# Patient Record
Sex: Female | Born: 1965 | Race: Black or African American | Hispanic: No | Marital: Single | Smoking: Current every day smoker
Health system: Southern US, Community
[De-identification: ages and names within clinical notes are randomized; demographics above are authoritative.]

## PROBLEM LIST (undated history)

## (undated) ENCOUNTER — Emergency Department: Admission: EM | Payer: Self-pay

## (undated) DIAGNOSIS — I10 Essential (primary) hypertension: Secondary | ICD-10-CM

## (undated) DIAGNOSIS — E7800 Pure hypercholesterolemia, unspecified: Secondary | ICD-10-CM

## (undated) DIAGNOSIS — A599 Trichomoniasis, unspecified: Secondary | ICD-10-CM

## (undated) HISTORY — DX: Trichomoniasis, unspecified: A59.9

---

## 2019-12-28 ENCOUNTER — Inpatient Hospital Stay: Admit: 2019-12-28 | Discharge: 2019-12-28 | Disposition: A | Discharge status: Home / Self Care | Payer: Self-pay
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
## 2020-02-04 ENCOUNTER — Ambulatory Visit: Payer: Self-pay | Admitting: Physician Assistant

## 2020-02-04 ENCOUNTER — Other Ambulatory Visit: Payer: Self-pay

## 2020-02-04 ENCOUNTER — Encounter: Payer: Self-pay | Admitting: Physician Assistant

## 2020-02-04 DIAGNOSIS — Z7189 Other specified counseling: Secondary | ICD-10-CM

## 2020-02-04 DIAGNOSIS — Z113 Encounter for screening for infections with a predominantly sexual mode of transmission: Secondary | ICD-10-CM

## 2020-02-04 DIAGNOSIS — I10 Essential (primary) hypertension: Secondary | ICD-10-CM | POA: Insufficient documentation

## 2020-02-04 DIAGNOSIS — B9689 Other specified bacterial agents as the cause of diseases classified elsewhere: Secondary | ICD-10-CM

## 2020-02-04 DIAGNOSIS — E7800 Pure hypercholesterolemia, unspecified: Secondary | ICD-10-CM | POA: Insufficient documentation

## 2020-02-04 DIAGNOSIS — Z8619 Personal history of other infectious and parasitic diseases: Secondary | ICD-10-CM | POA: Insufficient documentation

## 2020-02-04 DIAGNOSIS — N760 Acute vaginitis: Secondary | ICD-10-CM

## 2020-02-04 LAB — WET PREP FOR TRICH, YEAST, CLUE
Trichomonas Exam: NEGATIVE
Yeast Exam: NEGATIVE

## 2020-02-04 MED ORDER — METRONIDAZOLE 500 MG PO TABS: 500.0000 mg | ORAL_TABLET | Freq: Two times a day (BID) | ORAL | 0 refills | Status: AC

## 2020-02-04 MED ORDER — METRONIDAZOLE 500 MG PO TABS: 2000.0000 mg | ORAL_TABLET | Freq: Once | ORAL | 0 refills | Status: DC

## 2020-02-04 NOTE — Progress Notes (Signed)
Wet mount reviewed by provider; pt treated for BV per provider orders. Provider orders completed. 

## 2020-02-04 NOTE — Progress Notes (Signed)
[facility] Department STI clinic/screening visit  Subjective:  Gloria Perkins is a 54 y.o. female being seen today for an STI screening visit. The patient reports they do have symptoms.  Patient reports that they do not desire a pregnancy in the next year.   They reported they are not interested in discussing contraception today.  No LMP recorded (approximate).   Patient has the following medical conditions:  There are no problems to display for this patient.   Chief Complaint  Patient presents with  . SEXUALLY TRANSMITTED DISEASE    screening    HPI  Patient reports that she has had brownish discharge with slight itching for about 1 week.  Also states that she has been having some cramping on the right side of her abdomen for about that long as well.  Reports h/o HTN and high cholesterol but she is not currently taking any medicines for these conditions.  Last HIV test was about 1 year ago, is not sure when last pap was and has not had a period for 4 years or so/postmenopausal.  States that she was treated for Syphillis in [place] about 25-30 years ago.    See flowsheet for further details and programmatic requirements.    The following portions of the patient's history were reviewed and updated as appropriate: allergies, current medications, past medical history, past social history, past surgical history and problem list.  Objective:  There were no vitals filed for this visit.  Physical Exam Constitutional:      General: She is not in acute distress.    Appearance: Normal appearance.  HENT:     Head: Normocephalic and atraumatic.     Comments: No nits, lice or hair loss. No cervical, supraclavicular or axillary adenopathy.    Mouth/Throat:     Mouth: Mucous membranes are moist.     Pharynx: Oropharynx is clear. No oropharyngeal exudate or posterior oropharyngeal erythema.  Eyes:     Conjunctiva/sclera: Conjunctivae normal.  Pulmonary:     Effort: Pulmonary effort  is normal.  Abdominal:     Palpations: Abdomen is soft. There is no mass.     Tenderness: There is no abdominal tenderness. There is no guarding or rebound.  Genitourinary:    General: Normal vulva.     Rectum: Normal.     Comments: External genitalia/pubic area without nits, lice, edema, erythema, lesions and inguinal adenopathy. Vagina with normal mucosa, small amount of thin, white, discharge, pH= .4.5. Cervix without visible lesions. Uterus firm, mobile, nt, no masses, no CMT, no adnexal tenderness or fullness. Musculoskeletal:     Cervical back: Neck supple. No tenderness.  Skin:    General: Skin is warm and dry.     Findings: No bruising, erythema, lesion or rash.  Neurological:     Mental Status: She is alert and oriented to person, place, and time.  Psychiatric:        Mood and Affect: Mood normal.        Behavior: Behavior normal.        Thought Content: Thought content normal.        Judgment: Judgment normal.      Assessment and Plan:  Gloria Perkins is a 54 y.o. female presenting to the [facility] Department for STI screening  1. Screening for STD (sexually transmitted disease) Patient into clinic with symptoms. Rec condoms with all sex. Await test results.  Counseled that RN will call if needs to RTC for treatment once results are  back. - WET PREP FOR TRICH, YEAST, CLUE - Gonococcus culture - Chlamydia/Gonorrhea [place] [facility] - HIV [place] [facility] - Syphilis Serology, [place] [facility]  2. Bacterial vaginosis Treat for BV with Metronidazole 500mg  #14 1 po BID for 7 days with food, no EtOH for 24 hr before and until 72 hr after completing medicine. No sex for 7 days. Enc to use OTC antifungal cream if has itching during or just after treatment with antibiotic. - metroNIDAZOLE (FLAGYL) 500 MG tablet; Take 1 tablet (500 mg total) by mouth 2 (two) times daily for 7 days.  Dispense: 14 tablet; Refill: 0  3. Other specified counseling Enc patient to establish  with PCP for follow up on HTN and high cholesterol. PCP list given to patient today.     No follow-ups on file.  No future appointments.  , PA

## 2020-02-09 LAB — GONOCOCCUS CULTURE

## 2020-02-27 ENCOUNTER — Telehealth: Payer: Self-pay | Admitting: Family Medicine

## 2020-02-27 NOTE — Telephone Encounter (Signed)
pls call me I need my test results

## 2020-02-27 NOTE — Telephone Encounter (Signed)
Returned call to pt at phone # provided. Pt reports she would like to know her TR's from 02/04/2020. Verified I was speaking with pt with pt's name, DOB, and password. Counseled pt regarding her negative TR's for GC culture, GC, Chlamydia, and HIV. Consulted with Sadie Haber, PA regarding pt's RPR result and per Sadie Haber, PA based on RPR numbers it just shows [place] that pt has a history of RPR but does not need any further treatment at this time and just to continue getting RPR blood work done annually. Counseled pt per Sadie Haber, PA regarding pt's RPR result and pt states understanding. Counseled pt that if she needs copies of her TR's that I could set up a TR appt for pt, but pt declines at this time. Pt with no further questions or concerns at this time.

## 2020-02-27 NOTE — Telephone Encounter (Signed)
Consulted by RN re: RPR results and how to counsel patient.  Reviewed RN note and agree that it reflects discussion and recommendations.

## 2020-07-22 ENCOUNTER — Emergency Department
Admission: EM | Admit: 2020-07-22 | Discharge: 2020-07-23 | Disposition: A | Discharge status: Home / Self Care | Payer: Self-pay | Attending: Emergency Medicine | Admitting: Emergency Medicine

## 2020-07-22 ENCOUNTER — Emergency Department: Payer: Self-pay

## 2020-07-22 ENCOUNTER — Other Ambulatory Visit: Payer: Self-pay

## 2020-07-22 DIAGNOSIS — M2540 Effusion, unspecified joint: Secondary | ICD-10-CM

## 2020-07-22 DIAGNOSIS — I10 Essential (primary) hypertension: Secondary | ICD-10-CM | POA: Insufficient documentation

## 2020-07-22 DIAGNOSIS — M25532 Pain in left wrist: Secondary | ICD-10-CM | POA: Insufficient documentation

## 2020-07-22 DIAGNOSIS — F17210 Nicotine dependence, cigarettes, uncomplicated: Secondary | ICD-10-CM | POA: Insufficient documentation

## 2020-07-22 DIAGNOSIS — R2232 Localized swelling, mass and lump, left upper limb: Secondary | ICD-10-CM | POA: Insufficient documentation

## 2020-07-22 HISTORY — DX: Essential (primary) hypertension: I10

## 2020-07-22 LAB — COMPREHENSIVE METABOLIC PANEL
ALT: 21 U/L (ref 0–44)
AST: 23 U/L (ref 15–41)
Albumin: 4.2 g/dL (ref 3.5–5.0)
Alkaline Phosphatase: 97 U/L (ref 38–126)
Anion gap: 11 (ref 5–15)
BUN: 14 mg/dL (ref 6–20)
CO2: 26 mmol/L (ref 22–32)
Calcium: 9.1 mg/dL (ref 8.9–10.3)
Chloride: 101 mmol/L (ref 98–111)
Creatinine, Ser: 0.66 mg/dL (ref 0.44–1.00)
GFR, Estimated: >60 mL/min (ref >60)
Glucose, Bld: 96 mg/dL (ref 70–99)
Potassium: 4.1 mmol/L (ref 3.5–5.1)
Sodium: 138 mmol/L (ref 135–145)
Total Bilirubin: 0.8 mg/dL (ref 0.3–1.2)
Total Protein: 7.6 g/dL (ref 6.5–8.1)

## 2020-07-22 LAB — CBC WITH DIFFERENTIAL/PLATELET
Abs Immature Granulocytes: 0.01 10*3/uL (ref 0.00–0.07)
Basophils Absolute: 0.1 10*3/uL (ref 0.0–0.1)
Basophils Relative: 1 %
Eosinophils Absolute: 0.1 10*3/uL (ref 0.0–0.5)
Eosinophils Relative: 2 %
HCT: 40.7 % (ref 36.0–46.0)
Hemoglobin: 14.3 g/dL (ref 12.0–15.0)
Immature Granulocytes: 0 %
Lymphocytes Relative: 29 %
Lymphs Abs: 2.3 10*3/uL (ref 0.7–4.0)
MCH: 30 pg (ref 26.0–34.0)
MCHC: 35.1 g/dL (ref 30.0–36.0)
MCV: 85.5 fL (ref 80.0–100.0)
Monocytes Absolute: 0.7 10*3/uL (ref 0.1–1.0)
Monocytes Relative: 9 %
Neutro Abs: 4.6 10*3/uL (ref 1.7–7.7)
Neutrophils Relative %: 59 %
Platelets: 173 10*3/uL (ref 150–400)
RBC: 4.76 MIL/uL (ref 3.87–5.11)
RDW: 13.6 % (ref 11.5–15.5)
WBC: 7.9 10*3/uL (ref 4.0–10.5)
nRBC: 0 % (ref 0.0–0.2)

## 2020-07-22 LAB — URINALYSIS, COMPLETE (UACMP) WITH MICROSCOPIC
Bacteria, UA: NONE SEEN
Bilirubin Urine: NEGATIVE
Glucose, UA: NEGATIVE mg/dL
Hgb urine dipstick: NEGATIVE
Ketones, ur: NEGATIVE mg/dL
Leukocytes,Ua: NEGATIVE
Nitrite: NEGATIVE
Protein, ur: NEGATIVE mg/dL
Specific Gravity, Urine: 1.005 (ref 1.005–1.030)
pH: 7 (ref 5.0–8.0)

## 2020-07-22 LAB — URIC ACID: Uric Acid, Serum: 3.8 mg/dL (ref 2.5–7.1)

## 2020-07-22 MED ORDER — PREDNISONE 20 MG PO TABS: 60.0000 mg | ORAL_TABLET | Freq: Once | ORAL | Status: DC

## 2020-07-22 MED ORDER — CYCLOBENZAPRINE HCL 5 MG PO TABS: 5.0000 mg | ORAL_TABLET | Freq: Every day | ORAL | 0 refills | Status: DC

## 2020-07-22 MED ORDER — PREDNISONE 20 MG PO TABS: 20.0000 mg | ORAL_TABLET | Freq: Two times a day (BID) | ORAL | 0 refills | Status: AC

## 2020-07-22 MED ORDER — AMLODIPINE BESYLATE 5 MG PO TABS
5.0000 mg | ORAL_TABLET | Freq: Once | ORAL | Status: AC
  Administered 2020-07-22: 5 mg via ORAL
  Filled 2020-07-22: qty 1

## 2020-07-22 MED ORDER — DEXAMETHASONE SODIUM PHOSPHATE 10 MG/ML IJ SOLN
10.0000 mg | Freq: Once | INTRAMUSCULAR | Status: AC
  Administered 2020-07-22: 10 mg via INTRAVENOUS
  Filled 2020-07-22: qty 1

## 2020-07-22 MED ORDER — AMLODIPINE BESYLATE 5 MG PO TABS: 5.0000 mg | ORAL_TABLET | Freq: Every day | ORAL | 4 refills | Status: DC

## 2020-07-22 MED ORDER — TRAMADOL HCL 50 MG PO TABS: 50.0000 mg | ORAL_TABLET | Freq: Three times a day (TID) | ORAL | 0 refills | Status: AC | PRN

## 2020-07-22 NOTE — ED Triage Notes (Signed)
Reports bilateral wrist and hand pain that is worse at night. States ongoing for last 2 weeks but progressively getting worse. Intermittent headache X 2-3 months. Has taken OTC without relief. No injury to either hand/wirst area. Left wrist appears swollen. 

## 2020-07-22 NOTE — Discharge Instructions (Addendum)
You have been screened for your symptoms including x-rays and blood labs.  Symptoms are consistent with uncontrolled hypertension, arthritis of the joint and hands, and some probable tendinitis.  Wear the wrist splint as needed for comfort and support.  Take the prescription medications as provided.  You should select and follow with primary local community clinic for routine medical care.  Return to the ED if needed.

## 2020-07-22 NOTE — ED Provider Notes (Signed)
[facility] Emergency Department Provider Note ____________________________________________  Time seen: 2105  I have reviewed the triage vital signs and the nursing notes.  HISTORY  Chief Complaint  Wrist Pain and Headache  HPI Gloria Perkins is a 54 y.o. female who is been night with primary care provider secondary to lack of insurance coverage, presents to the ED with multiple complaints.  She complains of bilateral hand, left greater than right swelling particularly at the knuckles.  She also reports with swelling to the ulnar aspect of the left wrist.  Patient is right-hand dominant, and works as a Conservation officer, nature at [employer].  She is also reporting significant persistent headaches, and is aware that she has poorly controlled high blood pressure.  She denies any recent trauma, fall, chest pain, shortness of breath, weakness, or dizziness.  She has been taking over-the-counter arthritis strength Tylenol, as well as anti-inflammatories with limited benefit to her hand pain.  She presents now for evaluation of her symptoms.   Past Medical History:  Diagnosis Date  . Hypertension     Patient Active Problem List   Diagnosis Date Noted  . High blood pressure 02/04/2020  . Hypercholesterolemia 02/04/2020  . History of syphilis 02/04/2020    History reviewed. No pertinent surgical history.  Prior to Admission medications   Not on File    Allergies Chocolate and Tomato  History reviewed. No pertinent family history.  Social History Social History   Tobacco Use  . Smoking status: Current Every Day Smoker    Types: Cigarettes, E-cigarettes  . Smokeless tobacco: Never Used  Substance Use Topics  . Alcohol use: Yes    Comment: occasionally    Review of Systems  Constitutional: Negative for fever. Eyes: Negative for visual changes. ENT: Negative for sore throat. Cardiovascular: Negative for chest pain. Respiratory: Negative for shortness of  breath. Gastrointestinal: Negative for abdominal pain, vomiting and diarrhea. Genitourinary: Negative for dysuria. Musculoskeletal: Negative for back pain. Bilateral (L>R) hand finger swelling Skin: Negative for rash. Neurological: Negative for headaches, focal weakness or numbness. ____________________________________________  PHYSICAL EXAM:  VITAL SIGNS: ED Triage Vitals [07/22/20 1817]  Enc Vitals Group     BP (!) 197/115     Pulse Rate 93     Resp 18     Temp 98.9 F (37.2 C)     Temp Source Oral     SpO2 97 %     Weight 140 lb (63.5 kg)     Height 5\' 2"  (1.575 m)     Head Circumference      Peak Flow      Pain Score 6     Pain Loc      Pain Edu?      Excl. in GC?     Constitutional: Alert and oriented. Well appearing and in no distress. Head: Normocephalic and atraumatic. Eyes: Conjunctivae are normal. Normal extraocular movements Cardiovascular: Normal rate, regular rhythm. Normal distal pulses. Respiratory: Normal respiratory effort. No wheezes/rales/rhonchi. Musculoskeletal: Patient with slow composite fist bilaterally secondary to pain.  Patient noted to have hypertrophic appearing DIP and IP joints bilaterally.  Is also noted to have some soft swelling to the dorsal lateral wrist on the left.  No nail changes are appreciated.  No increased warmth or redness is noted.  Nontender with normal range of motion in all extremities.  Neurologic: Cranial nerves II to XII grossly intact. Normal gait without ataxia. Normal speech and language. No gross focal neurologic deficits are appreciated.  Skin:  Skin is warm, dry and intact. No rash noted. ____________________________________________   LABS (pertinent positives/negatives) Labs Reviewed  COMPREHENSIVE METABOLIC PANEL  CBC WITH DIFFERENTIAL/PLATELET  URINALYSIS, COMPLETE (UACMP) WITH MICROSCOPIC  URIC ACID  SEDIMENTATION RATE  C-REACTIVE PROTEIN  ____________________________________________   RADIOLOGY  DG Left  Hand  IMPRESSION: No fracture or dislocation. Mild degenerative arthritis as outlined above. Nonaggressive appearing lytic lesion within the a left fourth proximal phalanx, likely an enchondroma. ____________________________________________  PROCEDURES  Norvasc 5 mg PO Decadron 10 mg IVP Velcro wrist splint  Procedures ____________________________________________  INITIAL IMPRESSION / ASSESSMENT AND PLAN / ED COURSE  DDX: gout, OA, RA, tendinitis  Patient ED evaluation of left hand pain with some wrist swelling as well some elevated blood pressures.  Patient was evaluated for symptoms with labs that revealed no acute abnormalities.  Uric acid was normal and within range.  No effusion of any acute renal or hepatic dysfunction.  Inflammatory markers are pending at the time of this disposition.  Patient will be treated with anti-inflammatories in the form of steroid as well as muscle relaxants.  Initial dose of Norvasc is provided at this time she has been without her blood pressure medicines for greater than a year.  Should be provided with a courtesy prescription for Norvasc to restart at 5 mg with the option to increase to 10 mg if blood pressure does not respond in 7 to 10 days.  Patient is also encouraged to select a local community clinic for routine medical care.  She may opt to have her medications filled at the medication management program.  Return precautions have been discussed.  She is placed in a Velcro cock-up splint for comfort and support.  Return precautions have been discussed.  Gloria Perkins was evaluated in Emergency Department on 07/22/2020 for the symptoms described in the history of present illness. She was evaluated in the context of the global COVID-19 pandemic, which necessitated consideration that the patient might be at risk for infection with the SARS-CoV-2 virus that causes COVID-19. Institutional protocols and algorithms that pertain to the evaluation of patients at  risk for COVID-19 are in a state of rapid change based on information released by regulatory bodies including the CDC and federal and state organizations. These policies and algorithms were followed during the patient's care in the ED. ____________________________________________  FINAL CLINICAL IMPRESSION(S) / ED DIAGNOSES  Final diagnoses:  Painful swelling of joint  Severe uncontrolled hypertension      Evalette Montrose, Charlesetta Ivory, PA-C 07/23/20 0006    Chesley Noon, MD 07/28/20 [phone]

## 2020-07-23 LAB — C-REACTIVE PROTEIN: CRP: 1.4 mg/dL — ABNORMAL HIGH (ref <1.0)

## 2020-07-23 LAB — SEDIMENTATION RATE: Sed Rate: 12 mm/hr (ref 0–22)

## 2021-07-05 ENCOUNTER — Emergency Department
Admission: EM | Admit: 2021-07-05 | Discharge: 2021-07-05 | Disposition: A | Discharge status: Home / Self Care | Payer: Medicaid Other | Attending: Emergency Medicine | Admitting: Emergency Medicine

## 2021-07-05 ENCOUNTER — Encounter: Payer: Self-pay | Admitting: Emergency Medicine

## 2021-07-05 ENCOUNTER — Other Ambulatory Visit: Payer: Self-pay

## 2021-07-05 ENCOUNTER — Emergency Department: Payer: Medicaid Other

## 2021-07-05 DIAGNOSIS — S0181XA Laceration without foreign body of other part of head, initial encounter: Secondary | ICD-10-CM | POA: Insufficient documentation

## 2021-07-05 DIAGNOSIS — Z5321 Procedure and treatment not carried out due to patient leaving prior to being seen by health care provider: Secondary | ICD-10-CM | POA: Insufficient documentation

## 2021-07-05 HISTORY — DX: Pure hypercholesterolemia, unspecified: E78.00

## 2021-07-05 NOTE — ED Notes (Signed)
Pt states anger regarding wait times and her son not sitting with her in subwait. Pt visualized walking out of ED from subwait, pt visualized walking through lobby, NAD noted, steady ambulatory gait noted at this time. Pt A&O x4. Pt visualized walking out into the parking lot with NAD noted.
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
## 2021-07-06 ENCOUNTER — Ambulatory Visit: Payer: Medicaid Other

## 2021-09-24 ENCOUNTER — Emergency Department
Admission: EM | Admit: 2021-09-24 | Discharge: 2021-09-24 | Disposition: A | Discharge status: Home / Self Care | Payer: Self-pay | Attending: Emergency Medicine | Admitting: Emergency Medicine

## 2021-09-24 ENCOUNTER — Other Ambulatory Visit: Payer: Self-pay

## 2021-09-24 ENCOUNTER — Emergency Department: Payer: Self-pay

## 2021-09-24 DIAGNOSIS — Z202 Contact with and (suspected) exposure to infections with a predominantly sexual mode of transmission: Secondary | ICD-10-CM | POA: Insufficient documentation

## 2021-09-24 DIAGNOSIS — A599 Trichomoniasis, unspecified: Secondary | ICD-10-CM | POA: Insufficient documentation

## 2021-09-24 DIAGNOSIS — R519 Headache, unspecified: Secondary | ICD-10-CM | POA: Diagnosis not present

## 2021-09-24 DIAGNOSIS — G501 Atypical facial pain: Secondary | ICD-10-CM | POA: Insufficient documentation

## 2021-09-24 DIAGNOSIS — R69 Illness, unspecified: Secondary | ICD-10-CM | POA: Diagnosis not present

## 2021-09-24 LAB — COMPREHENSIVE METABOLIC PANEL
ALT: 18 U/L (ref 0–44)
AST: 22 U/L (ref 15–41)
Albumin: 4.2 g/dL (ref 3.5–5.0)
Alkaline Phosphatase: 65 U/L (ref 38–126)
Anion gap: 12 (ref 5–15)
BUN: 9 mg/dL (ref 6–20)
CO2: 23 mmol/L (ref 22–32)
Calcium: 9.4 mg/dL (ref 8.9–10.3)
Chloride: 104 mmol/L (ref 98–111)
Creatinine, Ser: 0.59 mg/dL (ref 0.44–1.00)
GFR, Estimated: >60 mL/min (ref >60)
Glucose, Bld: 79 mg/dL (ref 70–99)
Potassium: 2.9 mmol/L — ABNORMAL LOW (ref 3.5–5.1)
Sodium: 139 mmol/L (ref 135–145)
Total Bilirubin: 0.7 mg/dL (ref 0.3–1.2)
Total Protein: 7 g/dL (ref 6.5–8.1)

## 2021-09-24 LAB — WET PREP, GENITAL
Sperm: NONE SEEN
WBC, Wet Prep HPF POC: >=10 — AB (ref <10)
Yeast Wet Prep HPF POC: NONE SEEN

## 2021-09-24 LAB — CHLAMYDIA/NGC RT PCR (ARMC ONLY)
Chlamydia Tr: NOT DETECTED
N gonorrhoeae: NOT DETECTED

## 2021-09-24 LAB — URINALYSIS, COMPLETE (UACMP) WITH MICROSCOPIC
Bilirubin Urine: NEGATIVE
Glucose, UA: NEGATIVE mg/dL
Hgb urine dipstick: NEGATIVE
Ketones, ur: NEGATIVE mg/dL
Nitrite: NEGATIVE
Protein, ur: NEGATIVE mg/dL
Specific Gravity, Urine: 1.013 (ref 1.005–1.030)
pH: 6 (ref 5.0–8.0)

## 2021-09-24 LAB — CBC WITH DIFFERENTIAL/PLATELET
Abs Immature Granulocytes: 0.03 10*3/uL (ref 0.00–0.07)
Basophils Absolute: 0 10*3/uL (ref 0.0–0.1)
Basophils Relative: 0 %
Eosinophils Absolute: 0 10*3/uL (ref 0.0–0.5)
Eosinophils Relative: 0 %
HCT: 37.9 % (ref 36.0–46.0)
Hemoglobin: 13 g/dL (ref 12.0–15.0)
Immature Granulocytes: 0 %
Lymphocytes Relative: 22 %
Lymphs Abs: 2.3 10*3/uL (ref 0.7–4.0)
MCH: 29.5 pg (ref 26.0–34.0)
MCHC: 34.3 g/dL (ref 30.0–36.0)
MCV: 85.9 fL (ref 80.0–100.0)
Monocytes Absolute: 0.6 10*3/uL (ref 0.1–1.0)
Monocytes Relative: 6 %
Neutro Abs: 7.4 10*3/uL (ref 1.7–7.7)
Neutrophils Relative %: 72 %
Platelets: 187 10*3/uL (ref 150–400)
RBC: 4.41 MIL/uL (ref 3.87–5.11)
RDW: 12.4 % (ref 11.5–15.5)
WBC: 10.4 10*3/uL (ref 4.0–10.5)
nRBC: 0 % (ref 0.0–0.2)

## 2021-09-24 MED ORDER — POTASSIUM CHLORIDE CRYS ER 20 MEQ PO TBCR: 40.0000 meq | EXTENDED_RELEASE_TABLET | Freq: Every day | ORAL | 0 refills | Status: DC

## 2021-09-24 MED ORDER — OXYMETAZOLINE HCL 0.05 % NA SOLN
1.0000 | Freq: Once | NASAL | Status: AC
  Administered 2021-09-24: 1 via NASAL
  Filled 2021-09-24 (×2): qty 30

## 2021-09-24 MED ORDER — DOXYCYCLINE MONOHYDRATE 100 MG PO TABS: 100.0000 mg | ORAL_TABLET | Freq: Two times a day (BID) | ORAL | 0 refills | Status: AC

## 2021-09-24 MED ORDER — CEFTRIAXONE SODIUM 1 G IJ SOLR
500.0000 mg | Freq: Once | INTRAMUSCULAR | Status: AC
  Administered 2021-09-24: 500 mg via INTRAMUSCULAR
  Filled 2021-09-24: qty 10

## 2021-09-24 MED ORDER — POTASSIUM CHLORIDE CRYS ER 20 MEQ PO TBCR
40.0000 meq | EXTENDED_RELEASE_TABLET | Freq: Once | ORAL | Status: AC
  Administered 2021-09-24: 40 meq via ORAL
  Filled 2021-09-24: qty 2

## 2021-09-24 MED ORDER — METRONIDAZOLE 500 MG PO TABS: 500.0000 mg | ORAL_TABLET | Freq: Two times a day (BID) | ORAL | 0 refills | Status: DC

## 2021-09-24 MED ORDER — LIDOCAINE HCL (PF) 1 % IJ SOLN
INTRAMUSCULAR | Status: AC
  Administered 2021-09-24: 5 mL
  Filled 2021-09-24: qty 5

## 2021-09-24 MED ORDER — DOXYCYCLINE MONOHYDRATE 100 MG PO TABS: 100.0000 mg | ORAL_TABLET | Freq: Two times a day (BID) | ORAL | 0 refills | Status: DC

## 2021-09-24 MED ORDER — METRONIDAZOLE 500 MG PO TABS: 500.0000 mg | ORAL_TABLET | Freq: Two times a day (BID) | ORAL | 0 refills | Status: AC
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
## 2021-09-24 NOTE — ED Notes (Signed)
Nose bleeding at this time. Nose clip applied. Gloria Perkins, [place] aware. ?

## 2021-09-24 NOTE — Discharge Instructions (Addendum)
Take doxycycline twice daily for the next seven days.  ?Take Flagyl twice daily for the next seven days.  ?

## 2021-09-25 LAB — RPR
RPR Ser Ql: REACTIVE — AB
RPR Titer: 1:4 {titer}

## 2021-09-26 LAB — URINE CULTURE: Culture: <10000 — AB

## 2021-09-28 LAB — T.PALLIDUM AB, TOTAL: T Pallidum Abs: REACTIVE — AB

## 2021-10-01 ENCOUNTER — Telehealth: Payer: Self-pay | Admitting: Emergency Medicine

## 2021-10-01 NOTE — Telephone Encounter (Signed)
Called left a message for Ms. Gloria Perkins [phone]) ?Additional lab results came back "Reactive" to Treponemapalllidumant, Spoke to EDP Dr.Paduchowski was instructed to reach out to the patient and request further treatment (either here at [facility] or at the [facility] Department) with PCN injection.    ?

## 2021-10-05 NOTE — Telephone Encounter (Signed)
Called patient regarding need for treatment for syphilis and treatment available at [facility].  Left message asking her to call me. ?

## 2021-10-13 ENCOUNTER — Emergency Department
Admission: EM | Admit: 2021-10-13 | Discharge: 2021-10-13 | Disposition: A | Discharge status: Home / Self Care | Payer: 59 | Attending: Emergency Medicine | Admitting: Emergency Medicine

## 2021-10-13 ENCOUNTER — Encounter: Payer: Self-pay | Admitting: Emergency Medicine

## 2021-10-13 ENCOUNTER — Other Ambulatory Visit: Payer: Self-pay

## 2021-10-13 DIAGNOSIS — I10 Essential (primary) hypertension: Secondary | ICD-10-CM | POA: Diagnosis not present

## 2021-10-13 DIAGNOSIS — G44209 Tension-type headache, unspecified, not intractable: Secondary | ICD-10-CM | POA: Diagnosis not present

## 2021-10-13 DIAGNOSIS — R682 Dry mouth, unspecified: Secondary | ICD-10-CM | POA: Insufficient documentation

## 2021-10-13 DIAGNOSIS — R519 Headache, unspecified: Secondary | ICD-10-CM | POA: Diagnosis not present

## 2021-10-13 DIAGNOSIS — R040 Epistaxis: Secondary | ICD-10-CM | POA: Diagnosis not present

## 2021-10-13 MED ORDER — TRIAMCINOLONE ACETONIDE 0.025 % EX OINT: TOPICAL_OINTMENT | CUTANEOUS | 0 refills | Status: DC

## 2021-10-13 MED ORDER — PENICILLIN G BENZATHINE 1200000 UNIT/2ML IM SUSY
2.4000 10*6.[IU] | PREFILLED_SYRINGE | Freq: Once | INTRAMUSCULAR | Status: AC
  Administered 2021-10-13: 2.4 10*6.[IU] via INTRAMUSCULAR
  Filled 2021-10-13: qty 4

## 2021-10-13 MED ORDER — ATORVASTATIN CALCIUM 20 MG PO TABS: 20.0000 mg | ORAL_TABLET | Freq: Every day | ORAL | 2 refills | Status: DC

## 2021-10-13 MED ORDER — SALINE SPRAY 0.65 % NA SOLN: 1.0000 | Freq: Every day | NASAL | 0 refills | Status: DC

## 2021-10-13 MED ORDER — AMLODIPINE BESYLATE 5 MG PO TABS: 5.0000 mg | ORAL_TABLET | Freq: Every day | ORAL | 2 refills | Status: DC

## 2021-10-13 NOTE — ED Provider Notes (Signed)
? ?[facility] ?Provider Note ? ? ? Event Date/Time  ? First MD Initiated Contact with Patient 10/13/21 [phone]   ?  (approximate) ? ? ?History  ? ?Epistaxis ? ? ?HPI ? ?Gloria Perkins is a 56 y.o. female with history of hypertension and high cholesterol who presents with multiple complaints, but primarily due to an episode of epistaxis today.  The patient states that she was at work when her nose started spontaneously bleeding.  It stopped after a few minutes of pressure.  The patient states that over the last few months she has had brief nosebleeds almost daily, usually stopping after few minutes of pressure.  In addition she reports frequent headaches, mainly frontal, not specifically associated with the nosebleed.  The patient states that she was previously prescribed amlodipine and Lipitor, and most recently got it from the emergency department but ran out.  She previously had to pay out-of-pocket, but states that her insurance just kicked back and so she now will be able to get the medications if they represcribed. ? ?The patient states that she never got follow-up from her previous ED visit during which she tested positive for syphilis and has not followed up with the health department or been treated. ? ? ? ?Physical Exam  ? ?Triage Vital Signs: ?ED Triage Vitals  ?Enc Vitals Group  ?   BP 10/13/21 0652 (!) 144/97  ?   Pulse Rate 10/13/21 0652 88  ?   Resp 10/13/21 0652 18  ?   Temp 10/13/21 0652 98.8 ?F (37.1 ?C)  ?   Temp Source 10/13/21 0652 Oral  ?   SpO2 10/13/21 0652 99 %  ?   Weight 10/13/21 0652 145 lb (65.8 kg)  ?   Height 10/13/21 0652 5\' 2"  (1.575 m)  ?   Head Circumference --   ?   Peak Flow --   ?   Pain Score 10/13/21 0652 8  ?   Pain Loc --   ?   Pain Edu? --   ?   Excl. in GC? --   ? ? ?Most recent vital signs: ?Vitals:  ? 10/13/21 0652  ?BP: (!) 144/97  ?Pulse: 88  ?Resp: 18  ?Temp: 98.8 ?F (37.1 ?C)  ?SpO2: 99%  ? ? ? ?General: Awake, no distress.  ?CV:  Good peripheral  perfusion.  ?Resp:  Normal effort.  ?Abd:  No distention.  ?Other:  Bilateral nasal mucosa dry and irritated appearing.  Several small areas that appear to have recently bled.  No active bleeding.  Normal gait.  Motor intact in all extremities.  Normal coordination.  EOMI.  PERRLA.  Oropharynx clear. ? ? ?ED Results / Procedures / Treatments  ? ?Labs ?(all labs ordered are listed, but only abnormal results are displayed) ?Labs Reviewed - No data to display ? ? ?EKG ? ? ? ?RADIOLOGY ? ? ?PROCEDURES: ? ?Critical Care performed: No ? ?Procedures ? ? ?MEDICATIONS ORDERED IN ED: ?Medications  ?penicillin g benzathine (BICILLIN LA) 1200000 UNIT/2ML injection 2.4 Million Units (2.4 Million Units Intramuscular Given 10/13/21 0800)  ? ? ? ?IMPRESSION / MDM / ASSESSMENT AND PLAN / ED COURSE  ?I reviewed the triage vital signs and the nursing notes. ? ?56 year old female with a history of hypertension and high cholesterol presents with recurrent nosebleeds and headaches over the last few months. ? ?I reviewed the past medical records.  The patient was seen in the ED on 3/2 for the same symptoms and also reported  STD exposure at that time.  She associated the headaches and nosebleeds with recent facial trauma.  CT maxillofacial and CT head from that date were negative for acute abnormality.  The patient's RPR and syphilis antibody were both positive.  I see a record that the patient was called and a message was left but it does not appear that the patient followed up, and the patient confirms that she did not find out this result. ? ?On exam the patient is well-appearing with normal vital signs.  Neurologic exam is nonfocal.  She has dry appearing nasal mucosa with some evidence of recent bleeding but no epistaxis at this time. ? ?Overall presentation is consistent with recurrent anterior epistaxis likely due to dry and irritated nasal mucosa.  The headaches are chronic and unchanged today and the patient does not require  further work-up for this. ? ?I have ordered Bicillin for the syphilis test.  Is consistent with early latent syphilis as the patient does not have any symptoms of tertiary syphilis.  I instructed her to have her partner get tested as well. ? ?For the recurrent nosebleeds, we will give daily saline nasal spray and triamcinolone cream to be used weekly.  I have referred the patient to ENT. ? ?I also represcribed a 85-month supply of the patient's amlodipine and Lipitor. ? ?I counseled the patient on the results of my examination, lab results, and plan of care.  I gave her thorough return precautions and she expressed understanding. ? ? ? ?FINAL CLINICAL IMPRESSION(S) / ED DIAGNOSES  ? ?Final diagnoses:  ?Epistaxis  ?Nonintractable episodic headache, unspecified headache type  ? ? ? ?Rx / DC Orders  ? ?ED Discharge Orders   ? ?      Ordered  ?  amLODipine (NORVASC) 5 MG tablet  Daily       ? 10/13/21 0848  ?  atorvastatin (LIPITOR) 20 MG tablet  Daily       ? 10/13/21 0848  ?  sodium chloride (OCEAN) 0.65 % SOLN nasal spray  Daily       ? 10/13/21 0848  ?  triamcinolone (KENALOG) 0.025 % ointment       ? 10/13/21 0848  ? ?  ?  ? ?  ? ? ? ?Note:  This document was prepared using Dragon voice recognition software and may include unintentional dictation errors.  ?  Dionne Bucy, MD ?10/13/21 [phone] ? ?

## 2021-10-13 NOTE — Discharge Instructions (Signed)
Make an appointment to follow-up with the ENT doctor.  A referral has been provided.  You should also follow-up with a primary care provider.  You can find one through your insurance. ? ?As discussed, your partner should be tested for syphilis. ? ?Use the Ocean nasal spray daily and apply the triamcinolone ointment with a Q-tip once weekly. ? ?Return to the ER for new, worsening, or persistent severe nosebleeds, headaches, weakness or dizziness, or any other new or worsening symptoms that concern you. ?

## 2021-10-13 NOTE — ED Triage Notes (Addendum)
Patient ambulatory to triage with steady gait, without difficulty or distress noted; pt reports for last mo has been having intermittent left sided nosebleeds along with frontal HA; st has been seen for same recently with no neg findings by symptoms persist; also st that she received a rx for BP meds when she was here but has ran out ?

## 2021-10-13 NOTE — ED Notes (Signed)
See triage note  presents with  intermittent right sided nosebleed for about 1 month  also has had intermittent frontal h/a's  no bleeding noted on arrival to treatment room  pt is also out of b/p meds  ?

## 2022-02-05 DIAGNOSIS — F331 Major depressive disorder, recurrent, moderate: Secondary | ICD-10-CM | POA: Diagnosis not present

## 2022-03-04 ENCOUNTER — Ambulatory Visit: Payer: 59 | Admitting: Family

## 2022-04-27 ENCOUNTER — Encounter: Payer: Self-pay | Admitting: Obstetrics and Gynecology

## 2022-04-27 ENCOUNTER — Ambulatory Visit: Payer: Self-pay | Admitting: *Deleted

## 2022-04-27 ENCOUNTER — Ambulatory Visit (INDEPENDENT_AMBULATORY_CARE_PROVIDER_SITE_OTHER): Payer: 59 | Admitting: Obstetrics and Gynecology

## 2022-04-27 ENCOUNTER — Other Ambulatory Visit (HOSPITAL_COMMUNITY)
Admission: RE | Admit: 2022-04-27 | Discharge: 2022-04-27 | Disposition: A | Discharge status: Home / Self Care | Payer: 59 | Source: Ambulatory Visit | Attending: Obstetrics and Gynecology | Admitting: Obstetrics and Gynecology

## 2022-04-27 ENCOUNTER — Telehealth: Payer: Self-pay

## 2022-04-27 ENCOUNTER — Other Ambulatory Visit: Payer: Self-pay

## 2022-04-27 VITALS — BP 155/83 | HR 91 | Wt 144.0 lb

## 2022-04-27 DIAGNOSIS — Z124 Encounter for screening for malignant neoplasm of cervix: Secondary | ICD-10-CM | POA: Diagnosis not present

## 2022-04-27 DIAGNOSIS — Z202 Contact with and (suspected) exposure to infections with a predominantly sexual mode of transmission: Secondary | ICD-10-CM | POA: Insufficient documentation

## 2022-04-27 DIAGNOSIS — Z8619 Personal history of other infectious and parasitic diseases: Secondary | ICD-10-CM | POA: Insufficient documentation

## 2022-04-27 DIAGNOSIS — Z1211 Encounter for screening for malignant neoplasm of colon: Secondary | ICD-10-CM

## 2022-04-27 DIAGNOSIS — Z1231 Encounter for screening mammogram for malignant neoplasm of breast: Secondary | ICD-10-CM

## 2022-04-27 DIAGNOSIS — Z01419 Encounter for gynecological examination (general) (routine) without abnormal findings: Secondary | ICD-10-CM

## 2022-04-27 DIAGNOSIS — N760 Acute vaginitis: Secondary | ICD-10-CM | POA: Diagnosis not present

## 2022-04-27 DIAGNOSIS — R69 Illness, unspecified: Secondary | ICD-10-CM | POA: Diagnosis not present

## 2022-04-27 DIAGNOSIS — B3731 Acute candidiasis of vulva and vagina: Secondary | ICD-10-CM | POA: Diagnosis not present

## 2022-04-27 DIAGNOSIS — I10 Essential (primary) hypertension: Secondary | ICD-10-CM

## 2022-04-27 MED ORDER — NA SULFATE-K SULFATE-MG SULF 17.5-3.13-1.6 GM/177ML PO SOLN: 1.0000 | Freq: Once | ORAL | 0 refills | Status: AC

## 2022-04-27 NOTE — Telephone Encounter (Signed)
  Chief Complaint: requesting medication refills for BP  Symptoms: headaches, elevated BP at [facility] office today 155/83.  Frequency: today  Pertinent Negatives: Patient denies chest pain no difficulty breathing , no blurred vision, no weakness on either side of body  Disposition: [x] ED /[] Urgent Care (no appt availability in office) / [] Appointment(In office/virtual)/ []  [place] [facility]/ [] Home Care/ [] Refused Recommended Disposition /[] [place] [facility]/ []  Follow-up with PCP Additional Notes:   Patient last seen in ED for elevated BP and sx 10/13/21. Has not established care with a PCP. Patient upset she can not be seen by a provider sooner than January in her location.  Recommended patient go to ED due to running out of medication .  Offered to set up PCP. Call ended. Called patient back at 1335 to aettmept to schedule new patient appt. No answer. LVMTCB to schedule ne patient appt.    Reason for Disposition  Ran out of BP medications  Answer Assessment - Initial Assessment Questions 1. BLOOD PRESSURE: "What is the blood pressure?" "Did you take at least two measurements 5 minutes apart?"     Checked in OBGYN office  2. ONSET: "When did you take your blood pressure?"     Today 150 / ? In review of chart 155/83 3. HOW: "How did you take your blood pressure?" (e.g., automatic home BP monitor, visiting nurse)     OBGYN 4. HISTORY: "Do you have a history of high blood pressure?"     Yes  5. MEDICINES: "Are you taking any medicines for blood pressure?" "Have you missed any doses recently?"     Yes but has ran out  6. OTHER SYMPTOMS: "Do you have any symptoms?" (e.g., blurred vision, chest pain, difficulty breathing, headache, weakness)     headaches 7. PREGNANCY: "Is there any chance you are pregnant?" "When was your last menstrual period?"     na  Protocols used: Blood Pressure - High-A-AH

## 2022-04-27 NOTE — Progress Notes (Signed)
Annual   Last Pap: > 3 yrs Mammogram: Has only had one ever. STD Screening Desires. Hx of Trich in 09/2021 pt went to ED for Sx's of discharge.   CC: Migraines, Elevated B/P, Tightness and stiffness in hands.  Pt was on Norvasc 5mg  ran out. Would like refill until can get to PCP. Dark urine.

## 2022-04-27 NOTE — Progress Notes (Signed)
Obstetrics and Gynecology New Patient Evaluation  Appointment Date: 04/27/2022  OBGYN Clinic: [facility]  Primary Care Provider: None  Chief Complaint:  Chief Complaint  Patient presents with   Establish Care    History of Present Illness: Gloria Perkins is a 56 y.o. [id number] (No LMP recorded (lmp unknown). Patient is postmenopausal.), seen for the above chief complaint.   Patient with no GYN complaints or issues today.   Review of Systems: Pertinent items noted in HPI and remainder of comprehensive ROS otherwise negative.   Past Medical History:  Past Medical History:  Diagnosis Date   High cholesterol    Hypertension     Past Surgical History:  History reviewed. No pertinent surgical history.  Past Obstetrical History:  OB History  Gravida Para Term Preterm AB Living  5       2    SAB IAB Ectopic Multiple Live Births  1 1          # Outcome Date GA Lbr Len/2nd Weight Sex Delivery Anes PTL Lv  5 Gravida           4 Gravida           3 Gravida           2 SAB           1 IAB             Past Gynecological History: As per HPI. Periods: postmenopausal. Patient unsure of time but "a long time ago" History of Pap Smear(s): Yes.   Last pap unknown  Social History:  Social History   Socioeconomic History   Marital status: Single    Spouse name: Not on file   Number of children: Not on file   Years of education: Not on file   Highest education level: Not on file  Occupational History   Not on file  Tobacco Use   Smoking status: Every Day    Types: Cigarettes, E-cigarettes   Smokeless tobacco: Never  Vaping Use   Vaping Use: Never used  Substance and Sexual Activity   Alcohol use: Not Currently    Comment: occasionally   Drug use: Never   Sexual activity: Yes    Partners: Male  Other Topics Concern   Not on file  Social History Narrative   Not on file   Social Determinants of Health   Financial Resource Strain: Not on  file  Food Insecurity: Not on file  Transportation Needs: Not on file  Physical Activity: Not on file  Stress: Not on file  Social Connections: Not on file  Intimate Partner Violence: Not on file    Family History: History reviewed. No pertinent family history.  Medications Emiri Escudero had no medications administered during this visit. Current Outpatient Medications  Medication Sig Dispense Refill   amLODipine (NORVASC) 5 MG tablet Take 1 tablet (5 mg total) by mouth daily. 30 tablet 4   amLODipine (NORVASC) 5 MG tablet Take 1 tablet (5 mg total) by mouth daily. 30 tablet 2   atorvastatin (LIPITOR) 20 MG tablet Take 1 tablet (20 mg total) by mouth daily. 30 tablet 2   sodium chloride (OCEAN) 0.65 % SOLN nasal spray Place 1 spray into both nostrils daily. 44 mL 0   triamcinolone (KENALOG) 0.025 % ointment Apply in each nostril with a q-tip once weekly 15 g 0   No current facility-administered medications for this visit.    Allergies Chocolate and Tomato  Physical Exam:  BP (!) 155/83   Pulse 91   Wt 144 lb (65.3 kg)   LMP  (LMP Unknown)   BMI 26.34 kg/m  Body mass index is 26.34 kg/m. General appearance: Well nourished, well developed female in no acute distress.  Neck:  Supple, normal appearance, and no thyromegaly  Cardiovascular: normal s1 and s2.  No murmurs, rubs or gallops. Respiratory:  Clear to auscultation bilateral. Normal respiratory effort Abdomen: positive bowel sounds and no masses, hernias; diffusely non tender to palpation, non distended Breasts: breasts appear normal, no suspicious masses, no skin or nipple changes or axillary nodes, and normal palpation. Neuro/Psych:  Normal mood and affect.  Skin:  Warm and dry.  Lymphatic:  No inguinal lymphadenopathy.   Pelvic exam: is not limited by body habitus EGBUS: moderate atrophy within normal limits Vagina: mild atrophy, within normal limits and with no blood or discharge in the vault Cervix: normal  appearing cervix without tenderness, discharge or lesions. Uterus:  nonenlarged and non tender Adnexa:  normal adnexa and no mass, fullness, tenderness Rectovaginal: deferred  Laboratory: none  Radiology: none  Assessment: patient stable  Plan:  1. STD exposure Desires STI screening - Cytology - PAP - Cervicovaginal ancillary only - HIV Antibody (routine testing w rflx) - RPR - Hepatitis C antibody - Hepatitis B surface antigen  2. Cervical cancer screening - Cytology - PAP  3. Colon cancer screening Patient does have a PCP. She does not have MyChart. I showed her how to set up appointment on [employer] and info for [place] center given. I will also place a referral for PCP - Ambulatory referral to Gastroenterology  4. Encounter for screening mammogram for malignant neoplasm of breast - MM 3D SCREEN BREAST BILATERAL; Future  5. Well woman exam with routine gynecological exam   Durene Romans MD Attending [facility] for [facility])

## 2022-04-27 NOTE — Telephone Encounter (Signed)
Gastroenterology Pre-Procedure Review  Request Date: 05/27/22 Requesting Physician: Dr. 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  This occurred when she was an adolescent.    PATIENT REVIEW QUESTIONS: The patient responded to the following health history questions as indicated:    1. Are you having any GI issues? no 2. Do you have a personal history of Polyps? no 3. Do you have a family history of Colon Cancer or Polyps? no 4. Diabetes Mellitus? no 5. Joint replacements in the past 12 months?no 6. Major health problems in the past 3 months?no 7. Any artificial heart valves, MVP, or defibrillator?no    MEDICATIONS & ALLERGIES:    Patient reports the following regarding taking any anticoagulation/antiplatelet therapy:   Plavix, Coumadin, Eliquis, Xarelto, Lovenox, Pradaxa, Brilinta, or Effient? no Aspirin? no  Patient confirms/reports the following medications:  Current Outpatient Medications  Medication Sig Dispense Refill   amLODipine (NORVASC) 5 MG tablet Take 1 tablet (5 mg total) by mouth daily. 30 tablet 4   amLODipine (NORVASC) 5 MG tablet Take 1 tablet (5 mg total) by mouth daily. 30 tablet 2   atorvastatin (LIPITOR) 20 MG tablet Take 1 tablet (20 mg total) by mouth daily. 30 tablet 2   sodium chloride (OCEAN) 0.65 % SOLN nasal spray Place 1 spray into both nostrils daily. 44 mL 0   triamcinolone (KENALOG) 0.025 % ointment Apply in each nostril with a q-tip once weekly 15 g 0   No current facility-administered medications for this visit.    Patient confirms/reports the following allergies:  Allergies  Allergen Reactions   Chocolate    Tomato     No orders of the defined types were placed in this encounter.   AUTHORIZATION INFORMATION Primary Insurance: 1D#: Group #:  Secondary Insurance: 1D#: Group #:  SCHEDULE INFORMATION: Date:  Time: Location:

## 2022-04-28 DIAGNOSIS — F331 Major depressive disorder, recurrent, moderate: Secondary | ICD-10-CM | POA: Diagnosis not present

## 2022-04-28 LAB — HIV ANTIBODY (ROUTINE TESTING W REFLEX): HIV Screen 4th Generation wRfx: NONREACTIVE

## 2022-04-28 LAB — CERVICOVAGINAL ANCILLARY ONLY
Bacterial Vaginitis (gardnerella): POSITIVE — AB
Candida Glabrata: NEGATIVE
Candida Vaginitis: POSITIVE — AB
Chlamydia: NEGATIVE
Comment: NEGATIVE
Comment: NEGATIVE
Comment: NEGATIVE
Comment: NEGATIVE
Comment: NEGATIVE
Comment: NORMAL
Neisseria Gonorrhea: NEGATIVE
Trichomonas: NEGATIVE

## 2022-04-28 LAB — HEPATITIS B SURFACE ANTIGEN: Hepatitis B Surface Ag: NEGATIVE

## 2022-04-28 LAB — RPR: RPR Ser Ql: REACTIVE — AB

## 2022-04-28 LAB — HEPATITIS C ANTIBODY: Hep C Virus Ab: NONREACTIVE

## 2022-04-28 LAB — RPR, QUANT+TP ABS (REFLEX)
Rapid Plasma Reagin, Quant: 1:8 {titer} — ABNORMAL HIGH
T Pallidum Abs: REACTIVE — AB

## 2022-04-29 LAB — CYTOLOGY - PAP
Adequacy: ABSENT
Comment: NEGATIVE
Diagnosis: NEGATIVE
High risk HPV: NEGATIVE

## 2022-05-01 ENCOUNTER — Telehealth: Payer: Self-pay | Admitting: Obstetrics and Gynecology

## 2022-05-01 MED ORDER — FLUCONAZOLE 150 MG PO TABS: 150.0000 mg | ORAL_TABLET | Freq: Once | ORAL | 0 refills | Status: AC

## 2022-05-01 MED ORDER — METRONIDAZOLE 500 MG PO TABS: 500.0000 mg | ORAL_TABLET | Freq: Two times a day (BID) | ORAL | 0 refills | Status: DC

## 2022-05-01 NOTE — Telephone Encounter (Signed)
GYN Note Diflucan and flagyl sent in for patient. 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 She denies any new exposures. Will go through her history to see if she needs repeat treatment  Durene Romans MD Attending [facility] [place] [facility] 05/01/2022 Time: [phone]

## 2022-05-10 ENCOUNTER — Encounter: Payer: Self-pay | Admitting: Obstetrics and Gynecology

## 2022-05-25 DIAGNOSIS — F331 Major depressive disorder, recurrent, moderate: Secondary | ICD-10-CM | POA: Diagnosis not present

## 2022-05-27 ENCOUNTER — Ambulatory Visit: Admission: RE | Admit: 2022-05-27 | Payer: 59 | Source: Home / Self Care | Admitting: Gastroenterology

## 2022-05-27 ENCOUNTER — Encounter: Admission: RE | Payer: Self-pay | Source: Home / Self Care

## 2022-05-27 SURGERY — COLONOSCOPY WITH PROPOFOL
Anesthesia: General

## 2022-06-22 ENCOUNTER — Encounter: Payer: Self-pay | Admitting: Emergency Medicine

## 2022-06-22 ENCOUNTER — Emergency Department: Payer: 59

## 2022-06-22 ENCOUNTER — Emergency Department
Admission: EM | Admit: 2022-06-22 | Discharge: 2022-06-22 | Disposition: A | Discharge status: Home / Self Care | Payer: 59 | Attending: Emergency Medicine | Admitting: Emergency Medicine

## 2022-06-22 DIAGNOSIS — R7989 Other specified abnormal findings of blood chemistry: Secondary | ICD-10-CM | POA: Insufficient documentation

## 2022-06-22 DIAGNOSIS — I10 Essential (primary) hypertension: Secondary | ICD-10-CM | POA: Diagnosis not present

## 2022-06-22 DIAGNOSIS — R519 Headache, unspecified: Secondary | ICD-10-CM | POA: Diagnosis not present

## 2022-06-22 LAB — COMPREHENSIVE METABOLIC PANEL
ALT: 62 U/L — ABNORMAL HIGH (ref 0–44)
AST: 78 U/L — ABNORMAL HIGH (ref 15–41)
Albumin: 4.4 g/dL (ref 3.5–5.0)
Alkaline Phosphatase: 75 U/L (ref 38–126)
Anion gap: 18 — ABNORMAL HIGH (ref 5–15)
BUN: 15 mg/dL (ref 6–20)
CO2: 18 mmol/L — ABNORMAL LOW (ref 22–32)
Calcium: 8.7 mg/dL — ABNORMAL LOW (ref 8.9–10.3)
Chloride: 92 mmol/L — ABNORMAL LOW (ref 98–111)
Creatinine, Ser: 0.72 mg/dL (ref 0.44–1.00)
GFR, Estimated: >60 mL/min (ref >60)
Glucose, Bld: 181 mg/dL — ABNORMAL HIGH (ref 70–99)
Potassium: 3.1 mmol/L — ABNORMAL LOW (ref 3.5–5.1)
Sodium: 128 mmol/L — ABNORMAL LOW (ref 135–145)
Total Bilirubin: 1.1 mg/dL (ref 0.3–1.2)
Total Protein: 7.5 g/dL (ref 6.5–8.1)

## 2022-06-22 LAB — CBC WITH DIFFERENTIAL/PLATELET
Abs Immature Granulocytes: 0.04 10*3/uL (ref 0.00–0.07)
Basophils Absolute: 0.1 10*3/uL (ref 0.0–0.1)
Basophils Relative: 1 %
Eosinophils Absolute: 0 10*3/uL (ref 0.0–0.5)
Eosinophils Relative: 0 %
HCT: 41 % (ref 36.0–46.0)
Hemoglobin: 14.2 g/dL (ref 12.0–15.0)
Immature Granulocytes: 0 %
Lymphocytes Relative: 11 %
Lymphs Abs: 1.1 10*3/uL (ref 0.7–4.0)
MCH: 29.4 pg (ref 26.0–34.0)
MCHC: 34.6 g/dL (ref 30.0–36.0)
MCV: 84.9 fL (ref 80.0–100.0)
Monocytes Absolute: 0.6 10*3/uL (ref 0.1–1.0)
Monocytes Relative: 7 %
Neutro Abs: 7.9 10*3/uL — ABNORMAL HIGH (ref 1.7–7.7)
Neutrophils Relative %: 81 %
Platelets: 226 10*3/uL (ref 150–400)
RBC: 4.83 MIL/uL (ref 3.87–5.11)
RDW: 12.4 % (ref 11.5–15.5)
WBC: 9.7 10*3/uL (ref 4.0–10.5)
nRBC: 0 % (ref 0.0–0.2)

## 2022-06-22 LAB — TROPONIN I (HIGH SENSITIVITY)
Troponin I (High Sensitivity): 24 ng/L — ABNORMAL HIGH (ref <18)
Troponin I (High Sensitivity): 38 ng/L — ABNORMAL HIGH (ref <18)

## 2022-06-22 MED ORDER — CLONIDINE HCL 0.1 MG PO TABS
0.1000 mg | ORAL_TABLET | Freq: Once | ORAL | Status: AC
  Administered 2022-06-22: 0.1 mg via ORAL
  Filled 2022-06-22: qty 1

## 2022-06-22 MED ORDER — SODIUM CHLORIDE 0.9 % IV BOLUS: 1000.0000 mL | Freq: Once | INTRAVENOUS | Status: DC

## 2022-06-22 MED ORDER — ACETAMINOPHEN 500 MG PO TABS
1000.0000 mg | ORAL_TABLET | Freq: Once | ORAL | Status: AC
  Administered 2022-06-22: 1000 mg via ORAL
  Filled 2022-06-22: qty 2

## 2022-06-22 MED ORDER — AMLODIPINE BESYLATE 10 MG PO TABS: 10.0000 mg | ORAL_TABLET | Freq: Every day | ORAL | 5 refills | Status: DC

## 2022-06-22 MED ORDER — AMLODIPINE BESYLATE 5 MG PO TABS
10.0000 mg | ORAL_TABLET | Freq: Once | ORAL | Status: AC
  Administered 2022-06-22: 10 mg via ORAL
  Filled 2022-06-22: qty 2

## 2022-06-22 NOTE — ED Provider Notes (Signed)
[facility] Provider Note    Event Date/Time   First MD Initiated Contact with Patient 06/22/22 0827     (approximate)  History   Chief Complaint: Headache  HPI  Gloria Perkins is a 56 y.o. female with a past medical history of hypertension, hyperlipidemia presents to the emergency department for hypertension and headache.  According to the patient for the past 9 months she has not had a PCP and she has been out of her blood pressure medications.  She went to the emergency department and got a 22-month medication supply but ran out of that approximately 2 or 3 months ago.  Patient states she has been experiencing intermittent headache and intermittent nosebleeds.  Patient assumed her blood pressure was very high so she came to the emergency department.  Patient denies any chest pain or shortness of breath.  Denies any weakness or numbness.  Upon arrival to the emergency department patient has a blood pressure of 224/115.  Physical Exam   Triage Vital Signs: ED Triage Vitals  Enc Vitals Group     BP 06/22/22 0606 (!) 224/115     Pulse Rate 06/22/22 0606 (!) 117     Resp 06/22/22 0606 20     Temp 06/22/22 0606 98.6 F (37 C)     Temp Source 06/22/22 0606 Oral     SpO2 06/22/22 0606 97 %     Weight 06/22/22 0611 154 lb (69.9 kg)     Height 06/22/22 0611 5\' 2"  (1.575 m)     Head Circumference --      Peak Flow --      Pain Score 06/22/22 0611 10     Pain Loc --      Pain Edu? --      Excl. in GC? --     Most recent vital signs: Vitals:   06/22/22 0606 06/22/22 0839  BP: (!) 224/115 (!) 195/102  Pulse: (!) 117   Resp: 20   Temp: 98.6 F (37 C)   SpO2: 97%     General: Awake, no distress.  CV:  Good peripheral perfusion.  Regular rate and rhythm  Resp:  Normal effort.  Equal breath sounds bilaterally.  Abd:  No distention.  Soft, nontender.     ED Results / Procedures / Treatments   EKG  EKG viewed and interpreted by myself shows sinus  tachycardia 107 bpm with a narrow QRS, normal axis, normal intervals, increased QRS amplitude consistent with LVH, nonspecific ST changes but no ST elevation.  RADIOLOGY  I have reviewed and interpreted the CT head images.  No intracranial abnormality or bleed seen on my evaluation. Radiology has read the CT scan as negative.   MEDICATIONS ORDERED IN ED: Medications  cloNIDine (CATAPRES) tablet 0.1 mg (has no administration in time range)  amLODipine (NORVASC) tablet 10 mg (10 mg Oral Given 06/22/22 0840)  acetaminophen (TYLENOL) tablet 1,000 mg (1,000 mg Oral Given 06/22/22 0840)     IMPRESSION / MDM / ASSESSMENT AND PLAN / ED COURSE  I reviewed the triage vital signs and the nursing notes.  Patient's presentation is most consistent with acute presentation with potential threat to life or bodily function.  Patient presents emergency department for hypertension and headache patient states symptoms have been ongoing for quite some time she has been out of her medications for at least 2 months so the patient came to the emergency department for evaluation.  Overall the patient appears well she remains quite  hypertensive currently 195/102.  Patient states mild headache currently.  Reassuringly her CT scan of the head is negative her EKG shows LVH likely consistent with longstanding hypertension.  Patient's labs show normal CBC, overall reassuring chemistry besides slight LFT elevation and a slight gap.  Patient's troponin is mildly elevated at 24.  We will repeat a troponin.  We will treat with blood pressure medication including her prescribed amlodipine, a single dose of clonidine as well as Tylenol for her headache.  We will reassess.  Patient's labs show her troponin has elevated from 24-38.  Given the patient's significant hypertension and mild troponin elevation recommended that the patient be admitted to the hospital for ongoing management.  Patient states she cannot be admitted because  she has dogs at home and she lives alone.  Patient states he feels better and wishes to go home but is hoping we would restart her blood pressure medication.  I have ordered a 54-month supply of the patient's blood pressure medication and urged the patient to follow-up with her primary care doctor.  I also discussed that if her symptoms return such as headache or she develops any chest pain she needs to return immediately to the emergency department.  Patient understands the risk but still wishes to go home.  FINAL CLINICAL IMPRESSION(S) / ED DIAGNOSES   Uncontrolled hypertension   Note:  This document was prepared using Dragon voice recognition software and may include unintentional dictation errors.   Minna Antis, MD 06/22/22 [phone]

## 2022-06-22 NOTE — Discharge Instructions (Signed)
Please take your blood pressure medication as prescribed.  Please follow-up with a primary care doctor by calling the number provided to arrange a follow-up appointment soon as possible for ongoing blood pressure management.  Return to the emergency department for any significant headache, any weakness or numbness, or any other symptom personally concerning to yourself.

## 2022-06-22 NOTE — ED Triage Notes (Signed)
Pt presents via POV with complaints of headache due to HTN. Pt doesn't have a PCP and has been out of her BP medication for the last 2 months. 10/10 pain - has tried OTC pain meds without relief. Denies CP or SOB.

## 2022-07-29 ENCOUNTER — Encounter: Payer: Self-pay | Admitting: Emergency Medicine

## 2022-07-29 ENCOUNTER — Other Ambulatory Visit: Payer: Self-pay

## 2022-07-29 ENCOUNTER — Emergency Department: Payer: BC Managed Care – PPO

## 2022-07-29 ENCOUNTER — Emergency Department
Admission: EM | Admit: 2022-07-29 | Discharge: 2022-07-29 | Disposition: A | Discharge status: Home / Self Care | Payer: BC Managed Care – PPO | Attending: Emergency Medicine | Admitting: Emergency Medicine

## 2022-07-29 DIAGNOSIS — U071 COVID-19: Secondary | ICD-10-CM | POA: Insufficient documentation

## 2022-07-29 DIAGNOSIS — I10 Essential (primary) hypertension: Secondary | ICD-10-CM | POA: Diagnosis not present

## 2022-07-29 DIAGNOSIS — M47814 Spondylosis without myelopathy or radiculopathy, thoracic region: Secondary | ICD-10-CM | POA: Diagnosis not present

## 2022-07-29 DIAGNOSIS — R918 Other nonspecific abnormal finding of lung field: Secondary | ICD-10-CM | POA: Diagnosis not present

## 2022-07-29 DIAGNOSIS — R7611 Nonspecific reaction to tuberculin skin test without active tuberculosis: Secondary | ICD-10-CM | POA: Diagnosis not present

## 2022-07-29 DIAGNOSIS — M791 Myalgia, unspecified site: Secondary | ICD-10-CM | POA: Diagnosis not present

## 2022-07-29 LAB — CBC
HCT: 40.6 % (ref 36.0–46.0)
Hemoglobin: 14 g/dL (ref 12.0–15.0)
MCH: 29.7 pg (ref 26.0–34.0)
MCHC: 34.5 g/dL (ref 30.0–36.0)
MCV: 86.2 fL (ref 80.0–100.0)
Platelets: 176 10*3/uL (ref 150–400)
RBC: 4.71 MIL/uL (ref 3.87–5.11)
RDW: 12.6 % (ref 11.5–15.5)
WBC: 5.6 10*3/uL (ref 4.0–10.5)
nRBC: 0 % (ref 0.0–0.2)

## 2022-07-29 LAB — BASIC METABOLIC PANEL
Anion gap: 12 (ref 5–15)
BUN: 14 mg/dL (ref 6–20)
CO2: 24 mmol/L (ref 22–32)
Calcium: 9.3 mg/dL (ref 8.9–10.3)
Chloride: 101 mmol/L (ref 98–111)
Creatinine, Ser: 0.68 mg/dL (ref 0.44–1.00)
GFR, Estimated: >60 mL/min (ref >60)
Glucose, Bld: 92 mg/dL (ref 70–99)
Potassium: 3.6 mmol/L (ref 3.5–5.1)
Sodium: 137 mmol/L (ref 135–145)

## 2022-07-29 LAB — RESP PANEL BY RT-PCR (RSV, FLU A&B, COVID)  RVPGX2
Influenza A by PCR: NEGATIVE
Influenza B by PCR: NEGATIVE
Resp Syncytial Virus by PCR: NEGATIVE
SARS Coronavirus 2 by RT PCR: POSITIVE — AB

## 2022-07-29 MED ORDER — ATORVASTATIN CALCIUM 20 MG PO TABS: 20.0000 mg | ORAL_TABLET | Freq: Every day | ORAL | 2 refills | Status: DC

## 2022-07-29 MED ORDER — NIRMATRELVIR/RITONAVIR (PAXLOVID)TABLET: 3.0000 | ORAL_TABLET | Freq: Two times a day (BID) | ORAL | 0 refills | Status: AC

## 2022-07-29 NOTE — ED Provider Notes (Signed)
[facility] Provider Note    Event Date/Time   First MD Initiated Contact with Patient 07/29/22 1621     (approximate)   History   Body aches fatigue for 2 days  HPI  Gloria Perkins is a 57 y.o. female with a history of hypertension  Patient reports for about 2 days now she has been having bodyaches fatigue and at times feeling a little bit more weak generally.  No distress though.  She is ambulating back and forth to the bathroom as I first met her without distress  Patient reports that she has been having a bit of a dry cough and bodyaches.  Otherwise eating and drinking well.  No difficulty breathing.  She does feel like she has a little bit of congestion when she coughs though   Also reports that she needs a refill on her cholesterol medication.  I have sent this in for her, but she is going to wait at least 48 hours after Paxlovid treatment before she starts taking it again, understands not to take this while on Paxlovid  Physical Exam   Triage Vital Signs: ED Triage Vitals  Enc Vitals Group     BP 07/29/22 1537 134/84     Pulse Rate 07/29/22 1537 84     Resp 07/29/22 1537 18     Temp 07/29/22 1537 99.4 F (37.4 C)     Temp Source 07/29/22 1537 Oral     SpO2 07/29/22 1537 96 %     Weight 07/29/22 1525 154 lb 1.6 oz (69.9 kg)     Height 07/29/22 1525 _0  (1.575 m)     Head Circumference --      Peak Flow --      Pain Score 07/29/22 1525 0     Pain Loc --      Pain Edu? --      Excl. in [place]? --     Most recent vital signs: Vitals:   07/29/22 1537  BP: 134/84  Pulse: 84  Resp: 18  Temp: 99.4 F (37.4 C)  SpO2: 96%     General: Awake, no distress.  Ambulatory in the room back-and-forth to the bathroom without difficulty or distress.  Normal work of breathing CV:  Good peripheral perfusion.  Normal rate and tones Resp:  Normal effort.  No crackles or wheezing.  Work of breathing is normal.  Occasional central rhonchi are  noted Abd:  No distention.  Soft nontender nondistended Other:     ED Results / Procedures / Treatments   Labs (all labs ordered are listed, but only abnormal results are displayed) Labs Reviewed  RESP PANEL BY RT-PCR (RSV, FLU A&B, COVID)  [id number] - Abnormal; Notable for the following components:      Result Value   SARS Coronavirus 2 by RT PCR POSITIVE (*)    All other components within normal limits  CBC  BASIC METABOLIC PANEL     EKG     RADIOLOGY  Chest x-ray is interpreted by me as normal. (Reviewed the radiologist report which also noted mild costovertebral angle atelectasis or scarring)  No infiltrates appreciated   DG Chest 2 View  Result Date: 07/29/2022 CLINICAL DATA:  COVID-19 positive. EXAM: CHEST - 2 VIEW COMPARISON:  None Available. FINDINGS: Cardiac silhouette and mediastinal contours are within normal limits. Mild left costophrenic angle linear likely subsegmental atelectasis versus scarring. A single strain of similar atelectasis versus scarring is seen overlying the right costophrenic angle. Mild  hyperinflation. No pleural effusion or pneumothorax. Mild multilevel degenerative disc changes of the thoracic spine. IMPRESSION: 1. Mild left and mild right costophrenic angle subsegmental atelectasis versus scarring. 2. Mild hyperinflation of the lungs compatible with COPD. Electronically Signed   By: Yvonne Kendall M.D.   On: 07/29/2022 17:18      PROCEDURES:  Critical Care performed: No  Procedures   MEDICATIONS ORDERED IN ED: Medications - No data to display   IMPRESSION / MDM / [place] / ED COURSE  I reviewed the triage vital signs and the nursing notes.                              Differential diagnosis includes, but is not limited to, viral infection, bronchitis, upper respiratory infection etc.  Given the patient's labs which indicate positive COVID tested.  She is about 48 hours a day illness.  She does have a history of  hypertension.  I discussed with her risks and benefits of Paxlovid therapy, she would like to be treated with Paxlovid.  Have ordered CBC and metabolic panel to review her GFR prior to initiation of therapy.  She does currently take statin medication but she has run out of it and is requesting refill, she will hold this for at least 48 hours past completion of her Paxlovid and then resume her statin.  She is alert well-oriented nontoxic well-appearing.  Appears appropriate for ongoing outpatient treatment and careful return precautions discussed  Patient's presentation is most consistent with acute complicated illness / injury requiring diagnostic workup.  Reviewed Paxlovid with pharmacy, Gabriel Cirri,.  Patient will be holding and understands to hold her statin medication until at least 48 hours after completing Paxlovid.  GFR appropriate for normal dosing.  Common adverse reactions and careful return precautions discussed with patient  Return precautions and treatment recommendations and follow-up discussed with the patient who is agreeable with the plan.  Patient resting comfortably fully alert no distress agreeable with plan for treatment and prescriptions        FINAL CLINICAL IMPRESSION(S) / ED DIAGNOSES   Final diagnoses:  COVID-19     Rx / DC Orders   ED Discharge Orders          Ordered    atorvastatin (LIPITOR) 20 MG tablet  Daily        07/29/22 1652    nirmatrelvir/ritonavir (PAXLOVID) 20 x 150 MG & 10 x 100MG TABS  2 times daily        07/29/22 1927             Note:  This document was prepared using Dragon voice recognition software and may include unintentional dictation errors.   Delman Kitten, MD 07/29/22 [phone]

## 2022-07-29 NOTE — Discharge Instructions (Addendum)
Please go to the following website to schedule new (and existing) patient appointments:   [email or web address]   The following is a list of primary care offices in the area who are accepting new patients at this time.  Please reach out to one of them directly and let them know you would like to schedule an appointment to follow up on an Emergency Department visit, and/or to establish a new primary care provider (PCP).  There are likely other primary care clinics in the are who are accepting new patients, but this is an excellent place to start:  [facility] Lead physician: Dr Angela Bacigalupo [address] [place], [place] [address] [phone]  [facility] Lead Physician: Dr Krichna Sowles [address], [place], [place] [address] [phone]  [facility]  Lead Physician: Dr Megan Johnson [address], [place], [place] [address] [phone]  [facility] [place] [facility] Lead Physician: Dr Alex Karamalegos [address], [place], [place] [address] [phone]  [facility] [place] Lead Physician: Dr Laura Berglund [address], [place], [place] [address] [phone]   

## 2022-07-29 NOTE — ED Triage Notes (Signed)
C/O bodyaches and chills x 2 days.  Flu is going around work  AAOx3.  Skin warm and dry. NAD

## 2022-07-29 NOTE — ED Notes (Signed)
See triage note  Presents with body aches,headache and low grade fever  Also states she felt weak today

## 2022-10-08 ENCOUNTER — Encounter: Payer: Self-pay | Admitting: Advanced Practice Midwife

## 2022-10-08 ENCOUNTER — Ambulatory Visit: Payer: BC Managed Care – PPO | Admitting: Advanced Practice Midwife

## 2022-10-08 DIAGNOSIS — F17200 Nicotine dependence, unspecified, uncomplicated: Secondary | ICD-10-CM

## 2022-10-08 DIAGNOSIS — F319 Bipolar disorder, unspecified: Secondary | ICD-10-CM

## 2022-10-08 DIAGNOSIS — Z113 Encounter for screening for infections with a predominantly sexual mode of transmission: Secondary | ICD-10-CM

## 2022-10-08 DIAGNOSIS — T7421XA Adult sexual abuse, confirmed, initial encounter: Secondary | ICD-10-CM | POA: Insufficient documentation

## 2022-10-08 DIAGNOSIS — T7421XS Adult sexual abuse, confirmed, sequela: Secondary | ICD-10-CM

## 2022-10-08 DIAGNOSIS — Z8619 Personal history of other infectious and parasitic diseases: Secondary | ICD-10-CM

## 2022-10-08 DIAGNOSIS — I10 Essential (primary) hypertension: Secondary | ICD-10-CM

## 2022-10-08 LAB — HM HIV SCREENING LAB: HM HIV Screening: NEGATIVE

## 2022-10-08 LAB — WET PREP FOR TRICH, YEAST, CLUE
Trichomonas Exam: NEGATIVE
Yeast Exam: NEGATIVE

## 2022-10-08 NOTE — Progress Notes (Signed)
[facility] Department  STI clinic/screening visit [place] [address] [phone]  Subjective:  Gloria Perkins is a 57 y.o. widowed  BF G5P3  smoker female being seen today for an STI screening visit. The patient reports they do have symptoms.  Patient reports that they do not desire a pregnancy in the next year.   They reported they are not interested in discussing contraception today.    No LMP recorded (lmp unknown). Patient is postmenopausal.  Patient has the following medical conditions:   Patient Active Problem List   Diagnosis Date Noted   History of trichomoniasis 04/27/2022   High blood pressure 02/04/2020   Hypercholesterolemia 02/04/2020   History of syphilis 04/27/22 02/04/2020    Chief Complaint  Patient presents with   STD Screening    HPI  Patient reports c/o her ex fiance gave her trich 04/2022 and now having dark d/c since last week and low abdominal pain intermittently x 2 mo. Smoking and vaping. Last MJ 08/22/22.  Last ETOH 2021. LMP not since age 57. Last sex 07/23/22 without condom; with current partner x 3 years; 1 partner in last 3 mo. Last pap 04/27/22 neg HPV neg. Raped age 57  Does the patient using douching products? No  Last HIV test per patient/review of record was No results found for: "HMHIVSCREEN"  Lab Results  Component Value Date   HIV Non Reactive 04/27/2022   Patient reports last pap was  Lab Results  Component Value Date   DIAGPAP  04/27/2022    - Negative for intraepithelial lesion or malignancy (NILM)   No results found for: "SPECADGYN"  Screening for MPX risk: Does the patient have an unexplained rash? No Is the patient MSM? No Does the patient endorse multiple sex partners or anonymous sex partners? No Did the patient have close or sexual contact with a person diagnosed with MPX? No Has the patient traveled outside the [place] where MPX is endemic? No Is there a high clinical suspicion for MPX--  evidenced by one of the following No  -Unlikely to be chickenpox  -Lymphadenopathy  -Rash that present in same phase of evolution on any given body part See flowsheet for further details and programmatic requirements.   Immunization history:   There is no immunization history on file for this patient.   The following portions of the patient's history were reviewed and updated as appropriate: allergies, current medications, past medical history, past social history, past surgical history and problem list.  Objective:  There were no vitals filed for this visit.  Physical Exam Vitals and nursing note reviewed.  Constitutional:      Appearance: Normal appearance.  HENT:     Head: Normocephalic and atraumatic.     Mouth/Throat:     Mouth: Mucous membranes are moist.     Pharynx: Oropharynx is clear. No oropharyngeal exudate or posterior oropharyngeal erythema.  Eyes:     Conjunctiva/sclera: Conjunctivae normal.  Pulmonary:     Effort: Pulmonary effort is normal.  Abdominal:     Palpations: Abdomen is soft. There is no mass.     Tenderness: There is no abdominal tenderness. There is no rebound.     Comments: Soft without masses or tenderness, poor tone  Genitourinary:    General: Normal vulva.     Exam position: Lithotomy position.     Pubic Area: No rash or pubic lice.      Labia:        Right:  No rash or lesion.        Left: No rash or lesion.      Vagina: Vaginal discharge (grey leukorrhea, ph<4.5) present. No erythema, bleeding or lesions.     Cervix: Normal.     Uterus: Normal.      Adnexa: Right adnexa normal and left adnexa normal.     Rectum: Normal.     Comments: pH = <4.5 Lymphadenopathy:     Head:     Right side of head: No preauricular or posterior auricular adenopathy.     Left side of head: No preauricular or posterior auricular adenopathy.     Cervical: No cervical adenopathy.     Right cervical: No superficial, deep or posterior cervical adenopathy.     Left cervical: No superficial, deep or posterior cervical adenopathy.     Upper Body:     Right upper body: No supraclavicular, axillary or epitrochlear adenopathy.     Left upper body: No supraclavicular, axillary or epitrochlear adenopathy.     Lower Body: No right inguinal adenopathy. No left inguinal adenopathy.  Skin:    General: Skin is warm and dry.     Findings: No rash.  Neurological:     Mental Status: She is alert and oriented to person, place, and time.      Assessment and Plan:  [name] is a 57 y.o. female presenting to the [facility] Department for STI screening  1. History of syphilis 04/27/22  - [facility] - Syphilis Serology, [facility] - Chlamydia/Gonorrhea [place]  3. Screening examination for venereal disease Treat wet mount per standing orders Immunization nurse consult Please give pt contact info for [place], [place], [place], [place]   Patient accepted all screenings including  vaginal CT/GC and bloodwork for HIV/RPR, and wet prep. Patient meets criteria for HepB screening? No. Ordered? no Patient meets criteria for HepC screening? Yes. Ordered? no  Treat wet prep per standing order Discussed time line for State Lab results and that patient will be called with positive results and encouraged patient to call if she had not heard in 2 weeks.  Counseled to return or seek care for continued or worsening symptoms Recommended repeat testing in 3 months with positive results. Recommended condom use with all sex  Patient is currently using  nothing  to prevent pregnancy.    No follow-ups on file.  Future Appointments  Date Time Provider [place]  11/10/2022 10:00 AM [place], Devonne Doughty, DO [place], CNM

## 2022-10-08 NOTE — Progress Notes (Signed)
In House labs reviewed at visit. [employer] and condoms given. BThiele RN

## 2022-10-14 ENCOUNTER — Telehealth: Payer: Self-pay | Admitting: Family Medicine

## 2022-10-14 NOTE — Telephone Encounter (Signed)
Patient states that she was here on 10/08/2022 for a STI CK and that she was told she would be called if something came back positive. Patient states that she has two missed calls from [phone] [facility]. She can not answer her phone until after 4 pm when she gets off work. She is worried that something is wrong with her results and she is asking if she needs to just come to [facility] when she gets off of work at 4.

## 2022-10-14 NOTE — Telephone Encounter (Signed)
Pt states that Rexanne Mano had called and left the message  I informed pt that we have not received her STD results yet and that I will contact her if I receive positive result.  Windle Guard, RN

## 2022-11-10 ENCOUNTER — Ambulatory Visit (INDEPENDENT_AMBULATORY_CARE_PROVIDER_SITE_OTHER): Payer: BC Managed Care – PPO | Admitting: Family Medicine

## 2022-11-10 ENCOUNTER — Encounter: Payer: Self-pay | Admitting: Family Medicine

## 2022-11-10 ENCOUNTER — Ambulatory Visit
Admission: RE | Admit: 2022-11-10 | Discharge: 2022-11-10 | Disposition: A | Discharge status: Home / Self Care | Payer: BC Managed Care – PPO | Source: Ambulatory Visit | Attending: Family Medicine | Admitting: Family Medicine

## 2022-11-10 ENCOUNTER — Ambulatory Visit
Admission: RE | Admit: 2022-11-10 | Discharge: 2022-11-10 | Disposition: A | Discharge status: Home / Self Care | Payer: BC Managed Care – PPO | Attending: Family Medicine | Admitting: Family Medicine

## 2022-11-10 VITALS — BP 133/83 | HR 92 | Ht 62.0 in | Wt 150.0 lb

## 2022-11-10 DIAGNOSIS — M19041 Primary osteoarthritis, right hand: Secondary | ICD-10-CM

## 2022-11-10 DIAGNOSIS — M19042 Primary osteoarthritis, left hand: Secondary | ICD-10-CM

## 2022-11-10 DIAGNOSIS — I10 Essential (primary) hypertension: Secondary | ICD-10-CM

## 2022-11-10 DIAGNOSIS — R519 Headache, unspecified: Secondary | ICD-10-CM

## 2022-11-10 DIAGNOSIS — F5101 Primary insomnia: Secondary | ICD-10-CM

## 2022-11-10 DIAGNOSIS — Z1211 Encounter for screening for malignant neoplasm of colon: Secondary | ICD-10-CM

## 2022-11-10 DIAGNOSIS — E7800 Pure hypercholesterolemia, unspecified: Secondary | ICD-10-CM

## 2022-11-10 DIAGNOSIS — Z7689 Persons encountering health services in other specified circumstances: Secondary | ICD-10-CM

## 2022-11-10 DIAGNOSIS — J40 Bronchitis, not specified as acute or chronic: Secondary | ICD-10-CM

## 2022-11-10 MED ORDER — NURTEC 75 MG PO TBDP: 75.0000 mg | ORAL_TABLET | Freq: Every day | ORAL | 2 refills | Status: DC | PRN

## 2022-11-10 MED ORDER — AMLODIPINE BESYLATE 10 MG PO TABS: 10.0000 mg | ORAL_TABLET | Freq: Every day | ORAL | 3 refills | Status: DC

## 2022-11-10 MED ORDER — MELOXICAM 15 MG PO TABS: 15.0000 mg | ORAL_TABLET | Freq: Every day | ORAL | 3 refills | Status: DC

## 2022-11-10 MED ORDER — AZITHROMYCIN 250 MG PO TABS: ORAL_TABLET | ORAL | 0 refills | Status: DC

## 2022-11-10 MED ORDER — ZOLPIDEM TARTRATE 5 MG PO TABS: 5.0000 mg | ORAL_TABLET | Freq: Every evening | ORAL | 2 refills | Status: DC | PRN

## 2022-11-10 MED ORDER — PREDNISONE 10 MG PO TABS: ORAL_TABLET | ORAL | 0 refills | Status: DC

## 2022-11-10 MED ORDER — ATORVASTATIN CALCIUM 20 MG PO TABS: 20.0000 mg | ORAL_TABLET | Freq: Every day | ORAL | 3 refills | Status: DC

## 2022-11-10 NOTE — Progress Notes (Signed)
Subjective:    Patient ID: Gloria Perkins, female    DOB: 05-23-1966, 57 y.o.   MRN: [id number]  Gloria Perkins is a 57 y.o. female presenting on 11/10/2022 for New Patient (Initial Visit), Establish Care, and Hypertension  Establish care. Moved from [place] By herself now, husband and kids passed and no other family  HPI  Left Hand Pain, Arthritis Reptitive tasks with her hands and fingers, no new injury Reports left hand index, middle, ring fingers stiff pain and difficulty worse at night She has bulky joints Tried tylenol, naproxen advil, topical voltaren, limited or temporary relief  Former Smoker Chronic Cough Reports issue with coughing for past month can have something that feels stuck in throat with productive cough and mucus. No fever chills  History of Trichomonas STD Infection Previously treated for this  CHRONIC HTN Reports improved on medication Current Meds - Amlodipine 10mg  daily   Reports good compliance, took meds today. Tolerating well, w/o complaints. Admits frontal headache Denies CP, dyspnea, edema, dizziness / lightheadedness  Red Eye Conjunctivitis Chronic problem, seems unrelated to known trigger Tried OTC eye drops without relief  Epistaxis Random episodes, with bleeding. No other cause.  Bipolar Type 1 episode Uncertain if long term diagnosis, was dx previously due to acute stressors with loss of husband and children. Has improved, no longer w/ Psychiatry or mental health now. Also tried Remeron, Latuda, Abilify - off of these meds now doing better No longer on medication, has done well  Insomnia Headaches Frontal description, episodic can last hours, onset within past 1 month, seems related to poor sleep Now difficulty sleeping due to hand pain and just not sleeping well. Likely from problem with headaches, Tried Excedrin without relief Tried Trazodone and Ambien in past. Trazodone failed. Improved on Ambien PRN   Health Maintenance:  Due for  Colon CA Screening, will order Cologuard.      11/10/2022    1:07 PM  Depression screen PHQ 2/9  Decreased Interest 1  Down, Depressed, Hopeless 1  PHQ - 2 Score 2  Altered sleeping 3  Tired, decreased energy 1  Change in appetite 0  Feeling bad or failure about yourself  0  Trouble concentrating 1  Moving slowly or fidgety/restless 0  Suicidal thoughts 0  PHQ-9 Score 7    Past Medical History:  Diagnosis Date   High cholesterol    Hypertension    Trichimoniasis    No past surgical history on file. Social History   Socioeconomic History   Marital status: Single    Spouse name: Not on file   Number of children: Not on file   Years of education: Not on file   Highest education level: Not on file  Occupational History   Not on file  Tobacco Use   Smoking status: Every Day    Types: Cigarettes, E-cigarettes   Smokeless tobacco: Never  Vaping Use   Vaping Use: Never used  Substance and Sexual Activity   Alcohol use: Not Currently    Comment: last use 2021   Drug use: Yes    Types: Marijuana    Comment: last use 08/22/22   Sexual activity: Yes    Partners: Male  Other Topics Concern   Not on file  Social History Narrative   Not on file   Social Determinants of Health   Financial Resource Strain: Not on file  Food Insecurity: Not on file  Transportation Needs: Not on file  Physical Activity: Not on file  Stress:  Not on file  Social Connections: Not on file  Intimate Partner Violence: Not on file   No family history on file. No current outpatient medications on file prior to visit.   No current facility-administered medications on file prior to visit.    Review of Systems Per HPI unless specifically indicated above      Objective:    BP 133/83   Pulse 92   Ht 5\' 2"  (1.575 m)   Wt 150 lb (68 kg)   LMP  (LMP Unknown)   BMI 27.44 kg/m   Wt Readings from Last 3 Encounters:  11/10/22 150 lb (68 kg)  07/29/22 154 lb 1.6 oz (69.9 kg)  06/22/22  154 lb (69.9 kg)    Physical Exam Vitals and nursing note reviewed.  Constitutional:      General: She is not in acute distress.    Appearance: She is well-developed. She is not diaphoretic.     Comments: Well-appearing, comfortable, cooperative  HENT:     Head: Normocephalic and atraumatic.  Eyes:     General:        Right eye: No discharge.        Left eye: No discharge.     Conjunctiva/sclera: Conjunctivae normal.  Neck:     Thyroid: No thyromegaly.  Cardiovascular:     Rate and Rhythm: Normal rate and regular rhythm.     Heart sounds: Normal heart sounds. No murmur heard. Pulmonary:     Effort: Pulmonary effort is normal. No respiratory distress.     Breath sounds: Normal breath sounds. No wheezing or rales.  Musculoskeletal:        General: Deformity (bilateral hands L>R with finger MCP PIP DIP bulky deformity stiffness in motion.) present. Normal range of motion.     Cervical back: Normal range of motion and neck supple.  Lymphadenopathy:     Cervical: No cervical adenopathy.  Skin:    General: Skin is warm and dry.     Findings: No erythema or rash.  Neurological:     Mental Status: She is alert and oriented to person, place, and time.  Psychiatric:        Behavior: Behavior normal.     Comments: Well groomed, good eye contact, normal speech and thoughts     I have personally reviewed the radiology report from 06/22/22 on CT Head WO Contrast..  CLINICAL DATA:  57 year old female with history of worsening headache. Hypertension.   EXAM: CT HEAD WITHOUT CONTRAST   TECHNIQUE: Contiguous axial images were obtained from the base of the skull through the vertex without intravenous contrast.   RADIATION DOSE REDUCTION: This exam was performed according to the departmental dose-optimization program which includes automated exposure control, adjustment of the mA and/or kV according to patient size and/or use of iterative reconstruction technique.   COMPARISON:   Head CT 09/24/2021.   FINDINGS: Brain: No evidence of acute infarction, hemorrhage, hydrocephalus, extra-axial collection or mass lesion/mass effect.   Vascular: No hyperdense vessel or unexpected calcification.   Skull: Normal. Negative for fracture or focal lesion.   Sinuses/Orbits: No acute finding.   Other: None.   IMPRESSION: 1. No acute intracranial abnormalities. The appearance of the brain is normal.     Electronically Signed   By: Trudie Reed M.D.   On: 06/22/2022 06:31  Results for orders placed or performed in visit on 10/19/22  [facility] HIV SCREENING LAB  Result Value Ref Range   HM HIV Screening Negative - Validated  Assessment & Plan:   Problem List Items Addressed This Visit     Essential hypertension - Primary   Relevant Medications   amLODipine (NORVASC) 10 MG tablet   atorvastatin (LIPITOR) 20 MG tablet   Hypercholesterolemia   Relevant Medications   amLODipine (NORVASC) 10 MG tablet   atorvastatin (LIPITOR) 20 MG tablet   Other Visit Diagnoses     Encounter to establish care with new doctor       Screening for colon cancer       Relevant Orders   Cologuard   Frontal headache       Relevant Medications   amLODipine (NORVASC) 10 MG tablet   NURTEC 75 MG TBDP   meloxicam (MOBIC) 15 MG tablet   Primary insomnia       Relevant Medications   zolpidem (AMBIEN) 5 MG tablet   Primary osteoarthritis of both hands       Relevant Medications   meloxicam (MOBIC) 15 MG tablet   predniSONE (DELTASONE) 10 MG tablet   Other Relevant Orders   DG Hand Complete Left   Bronchitis       Relevant Medications   azithromycin (ZITHROMAX Z-PAK) 250 MG tablet   predniSONE (DELTASONE) 10 MG tablet       Establish care Review outside records  HYPERTENSION / HLD Re ordered medication - Norvasc (Amlodipine)  daily and Atorvastatin  daily  For headaches It may be due to poor sleep as well can cause headaches Otherwise it may be a migraine  headache Try the sample Nurtec ODT, dissolving tablet - if successful then we can order Rizatriptan ODT and future re order Nurtec if covered. If not helping then likely this can be poor sleep related., treat sleep and monitor headaches  For hand symptoms Likely some joint stiffness arthritis X-ray today to document, L Hand Trial on Voltaren OTC Rx Meloxicam for daily AS NEEDED use (After prednisone for cough)  For sleep May be underlying primary cause or question if bipolar related still Try the Ambien  nightly as needed.  For cough / infection Start Azithromycin Z pak (antibiotic) 2 tabs day 1, then 1 tab x 4 days, complete entire course even if improved  Add Prednisone taper 6 days if not improved breathing IF YOU TAKE this one, you will need to HOLD the Meloxicam.  Orders Placed This Encounter  Procedures   DG Hand Complete Left    Standing Status:   Future    Number of Occurrences:   1    Standing Expiration Date:   11/10/2023    Order Specific Question:   Reason for Exam (SYMPTOM  OR DIAGNOSIS REQUIRED)    Answer:   multiple joints in fingers DIP PIP MCP pain stiffness months eval arthritis    Order Specific Question:   Is patient pregnant?    Answer:   No    Order Specific Question:   Preferred imaging location?    Answer:   [facility]   Cologuard     Meds ordered this encounter  Medications   amLODipine (NORVASC) 10 MG tablet    Sig: Take 1 tablet (10 mg total) by mouth daily.    Dispense:  90 tablet    Refill:  3   atorvastatin (LIPITOR) 20 MG tablet    Sig: Take 1 tablet (20 mg total) by mouth daily.    Dispense:  90 tablet    Refill:  3   NURTEC 75 MG TBDP    Sig: Take  1 tablet (75 mg total) by mouth daily as needed (migraine headache). Max 1 tablet in 24 hours.    Dispense:  8 tablet    Refill:  2   zolpidem (AMBIEN) 5 MG tablet    Sig: Take 1 tablet (5 mg total) by mouth at bedtime as needed for sleep.    Dispense:  30 tablet    Refill:  2    meloxicam (MOBIC) 15 MG tablet    Sig: Take 1 tablet (15 mg total) by mouth daily.    Dispense:  30 tablet    Refill:  3   azithromycin (ZITHROMAX Z-PAK) 250 MG tablet    Sig: Take 2 tabs (  total) on Day 1. Take 1 tab ( ) daily for next 4 days.    Dispense:  6 tablet    Refill:  0   predniSONE (DELTASONE) 10 MG tablet    Sig: Take 6 tabs with breakfast Day 1, 5 tabs Day 2, 4 tabs Day 3, 3 tabs Day 4, 2 tabs Day 5, 1 tab Day 6.    Dispense:  21 tablet    Refill:  0      Follow up plan: Return in about 6 weeks (around 12/22/2022) for 6 week follow-up HTN, Arthritis, Insomnia.  Saralyn Pilar, DO [facility] 11/10/2022, 10:15 AM

## 2022-11-10 NOTE — Patient Instructions (Addendum)
Thank you for coming to the office today.  Re ordered medication - Norvasc (Amlodipine)  daily and Atorvastatin  daily  For headaches It may be due to poor sleep as well can cause headaches Otherwise it may be a migraine headache Try the sample Nurtec ODT, dissolving tablet   For hand symptoms Likely some joint stiffness arthritis  START anti inflammatory topical - OTC Voltaren (generic Diclofenac) topical 2-4 times a day as needed for pain swelling of affected joint for 1-2 weeks or longer.  For sleep Try the Ambien  nightly as needed.  For cough / infection  Start Azithromycin Z pak (antibiotic) 2 tabs day 1, then 1 tab x 4 days, complete entire course even if improved  Add Prednisone taper 6 days if not improved breathing IF YOU TAKE this one, you will need to HOLD the Meloxicam.   Please schedule a Follow-up Appointment to: Return in about 6 weeks (around 12/22/2022) for 6 week follow-up HTN, Arthritis, Insomnia.  If you have any other questions or concerns, please feel free to call the office or send a message through MyChart. You may also schedule an earlier appointment if necessary.  Additionally, you may be receiving a survey about your experience at our office within a few days to 1 week by e-mail or mail. We value your feedback.  Saralyn Pilar, DO [facility], [place]

## 2022-11-22 ENCOUNTER — Telehealth: Payer: Self-pay

## 2022-11-22 DIAGNOSIS — Z1211 Encounter for screening for malignant neoplasm of colon: Secondary | ICD-10-CM | POA: Diagnosis not present

## 2022-11-22 NOTE — Telephone Encounter (Signed)
-----   Message from Smitty Cords, DO sent at 11/22/2022  9:08 AM EDT ----- Unfortunately, if nothing at all is working from our treatment plan. I do not have a simple solution to offer quickly without talking to her again at length. We spent a while discussing her treatment plan. If her chronic arthritis hand pain is keeping her from sleeping and medication doesn't even work. She will likely need referral to Orthopedic specialist / Hand specialist at this point. I would suggest another visit in person or can do virtual that is fine, to follow up on our discussion and review a new referral and other medication options.  Saralyn Pilar, DO [facility] 11/22/2022, 9:08 AM

## 2022-11-22 NOTE — Telephone Encounter (Signed)
L:eft message for patient to call back to discuss results

## 2022-11-29 ENCOUNTER — Ambulatory Visit: Payer: Self-pay

## 2022-11-29 LAB — COLOGUARD: COLOGUARD: NEGATIVE

## 2022-11-29 NOTE — Telephone Encounter (Signed)
Summary: Pt requests that her Rx for zolpidem (AMBIEN) 5 MG tablet be increased to 10 MG   Pt requests that her Rx for zolpidem (AMBIEN) 5 MG tablet be increased to 10 MG. Pt also reports that she is still having the sharp pain on the right side of her stomach and hands are swelling. Pt requests call back. Cb# [phone]        2nd attempt to contact patient [phone] to review sx of right side stomach pain and hand swelling and medication increase request. No answer, LVMTCB [phone].

## 2022-11-29 NOTE — Telephone Encounter (Signed)
3rd attempt to contact patient on [phone] to review sx of right side stomach pain  and hand swelling and medication dose increase.  No answer, LVMTCB [phone].   Reason for Disposition  Third attempt to contact caller AND no contact made. Phone number verified.  Answer Assessment - Initial Assessment Questions N/A No contact to review sx of right side abdominal pain and hand swelling and medication increase dose request  Protocols used: No Contact or Duplicate Contact Call-A-AH

## 2022-11-29 NOTE — Telephone Encounter (Signed)
Pt requests that her Rx for zolpidem (AMBIEN) 5 MG tablet be increased to 10 MG. Pt also reports that she is still having the sharp pain on the right side of her stomach and hands are swelling. Pt requests call back. Cb# [phone]  Left message to call back about symptoms.

## 2022-11-30 ENCOUNTER — Telehealth: Payer: Self-pay

## 2022-11-30 DIAGNOSIS — F5101 Primary insomnia: Secondary | ICD-10-CM

## 2022-11-30 MED ORDER — ZOLPIDEM TARTRATE 10 MG PO TABS: 10.0000 mg | ORAL_TABLET | Freq: Every evening | ORAL | 2 refills | Status: DC | PRN

## 2022-11-30 NOTE — Telephone Encounter (Addendum)
Pt given results from Dr. Kirtland Bouchard, pt verbalized understanding. Pt is requesting for Ambien 10mg  to be sent to pharmacy since they help with sleep and pain. Pt states she already called in and advised her a message was sent back to nursing and will FU when able. Advised pt she can try to refill 5mg  with pharmacy since she had 2 RF remaining. Pt says she will let us know if they will not refill but still would like 10mg  to be sent in.

## 2022-11-30 NOTE — Telephone Encounter (Signed)
Left message for patient to call back regarding negative cologaurd results and recommendations. Ok for [facility] to give results.

## 2022-11-30 NOTE — Telephone Encounter (Signed)
Called patient back. Sent rx Zolpidem 10mg .  She wants to talk again in future about prednisone repeat, meloxicam and other options for hand pain. It is better. Prednisone helped the most  Gloria Pilar, DO [facility] 11/30/2022, 5:18 PM

## 2022-11-30 NOTE — Telephone Encounter (Signed)
Pt called back, she called [facility] and they told her she cannot refill Ambien 5mg  until 12/08/22 so pt would like Dr. Kirtland Bouchard to send in Ambien 10mg  so she can hopefully pick that up. Pt also states that meloxicam didn't help with pain or swelling but prednisone did.

## 2022-11-30 NOTE — Telephone Encounter (Signed)
-----   Message from Smitty Cords, DO sent at 11/29/2022  5:56 PM EDT ----- Please contact patient to review the following (No MyChart Access):  Cologuard result isNEGATIVE. This is a good result, meaning that it is very unlikely to have any abnormal colon polyps or colon cancer.  Next due for cologuard test in 3 years.  Saralyn Pilar, DO [facility] 11/29/2022, 5:56 PM

## 2022-11-30 NOTE — Telephone Encounter (Signed)
This encounter was created in error - please disregard.

## 2022-11-30 NOTE — Addendum Note (Signed)
Addended by: Smitty Cords on: 11/30/2022 05:18 PM   Modules accepted: Orders

## 2022-12-22 ENCOUNTER — Ambulatory Visit (INDEPENDENT_AMBULATORY_CARE_PROVIDER_SITE_OTHER): Payer: BC Managed Care – PPO | Admitting: Family Medicine

## 2022-12-22 ENCOUNTER — Encounter: Payer: Self-pay | Admitting: Family Medicine

## 2022-12-22 VITALS — BP 102/60 | HR 94 | Ht 62.0 in | Wt 148.0 lb

## 2022-12-22 DIAGNOSIS — J432 Centrilobular emphysema: Secondary | ICD-10-CM | POA: Diagnosis not present

## 2022-12-22 DIAGNOSIS — G8929 Other chronic pain: Secondary | ICD-10-CM

## 2022-12-22 DIAGNOSIS — M19041 Primary osteoarthritis, right hand: Secondary | ICD-10-CM | POA: Diagnosis not present

## 2022-12-22 DIAGNOSIS — M79642 Pain in left hand: Secondary | ICD-10-CM

## 2022-12-22 DIAGNOSIS — R040 Epistaxis: Secondary | ICD-10-CM

## 2022-12-22 DIAGNOSIS — M19042 Primary osteoarthritis, left hand: Secondary | ICD-10-CM

## 2022-12-22 DIAGNOSIS — I10 Essential (primary) hypertension: Secondary | ICD-10-CM

## 2022-12-22 DIAGNOSIS — R519 Headache, unspecified: Secondary | ICD-10-CM

## 2022-12-22 DIAGNOSIS — Z8249 Family history of ischemic heart disease and other diseases of the circulatory system: Secondary | ICD-10-CM

## 2022-12-22 MED ORDER — GABAPENTIN 100 MG PO CAPS: ORAL_CAPSULE | ORAL | 1 refills | Status: DC

## 2022-12-22 MED ORDER — BREZTRI AEROSPHERE 160-9-4.8 MCG/ACT IN AERO: 2.0000 | INHALATION_SPRAY | Freq: Two times a day (BID) | RESPIRATORY_TRACT | 11 refills | Status: DC

## 2022-12-22 MED ORDER — ALBUTEROL SULFATE HFA 108 (90 BASE) MCG/ACT IN AERS: 2.0000 | INHALATION_SPRAY | Freq: Four times a day (QID) | RESPIRATORY_TRACT | 5 refills | Status: DC | PRN

## 2022-12-22 NOTE — Assessment & Plan Note (Signed)
Clinically COPD Emphysema on history exam and imaging CXR 07/2022 No other advanced imaging or PFT No prior therapy or maintenance inhaler  Sample Breztri today 2 puff TWICE A DAY, rx sent to pharmacy as well Rx Albuterol AS NEEDED rescue  Limit vaping in future if possible Follow-up, consider Pulm if indicated.

## 2022-12-22 NOTE — Progress Notes (Signed)
Subjective:    Patient ID: Gloria Perkins, female    DOB: 08/28/1965, 57 y.o.   MRN: [id number]  Gloria Perkins is a 57 y.o. female presenting on 12/22/2022 for Hypertension, Insomnia, and Arthritis   HPI  Left Hand Pain, Arthritis Last visit 10/2022 Reptitive tasks with her hands and fingers, typing at work, no new injury Reports left hand index, middle, ring fingers stiff pain and difficulty worse at night She has bulky joints Tried tylenol, naproxen advil, topical voltaren, limited or temporary relief Pain in hand Keeping her awake, improved on Ambien 10mg  now able to sleep more   Former Smoker Chronic Cough Reports issue with coughing for past month can have something that feels stuck in throat with productive cough and mucus. No fever chills Previously improved breathing with Prednisone temporary course Not on inhalers or maintenance She uses E-Cig vaping now Last X-ray 07/2022 showed some signs of COPD   CHRONIC HTN Reports improved on medication Current Meds - Amlodipine 10mg  daily   Reports good compliance, took meds today. Tolerating well, w/o complaints. Admits frontal headache episodic, not today Denies CP, dyspnea, edema, dizziness / lightheadedness   Epistaxis Random episodes, with bleeding. No other cause. Still occurring, requesting ENT consult  Bipolar Type 1 episode Uncertain if long term diagnosis, was dx previously due to acute stressors with loss of husband and children. Has improved, no longer w/ Psychiatry or mental health now. Also tried Remeron, Latuda, Abilify - off of these meds now doing better No longer on medication, has done well   Insomnia Headaches Frontal description, episodic can last hours, onset within past 1 month, seems related to poor sleep Improved sleep now on Ambien 10mg  nightly AS NEEDED  Previously difficulty sleeping due to hand pain and just not sleeping well.  Likely from problem with headaches, Tried Excedrin without  relief Tried Trazodone and Ambien in past. Trazodone failed. Improved on Ambien PRN      11/10/2022    1:07 PM  Depression screen PHQ 2/9  Decreased Interest 1  Down, Depressed, Hopeless 1  PHQ - 2 Score 2  Altered sleeping 3  Tired, decreased energy 1  Change in appetite 0  Feeling bad or failure about yourself  0  Trouble concentrating 1  Moving slowly or fidgety/restless 0  Suicidal thoughts 0  PHQ-9 Score 7       No data to display            Social History   Tobacco Use   Smoking status: Every Day    Types: Cigarettes, E-cigarettes   Smokeless tobacco: Never  Vaping Use   Vaping Use: Never used  Substance Use Topics   Alcohol use: Not Currently    Comment: last use 2021   Drug use: Yes    Types: Marijuana    Comment: last use 08/22/22    Review of Systems Per HPI unless specifically indicated above     Objective:    BP 102/60   Pulse 94   Ht 5\' 2"  (1.575 m)   Wt 148 lb (67.1 kg)   LMP  (LMP Unknown)   SpO2 98%   BMI 27.07 kg/m   Wt Readings from Last 3 Encounters:  12/22/22 148 lb (67.1 kg)  11/10/22 150 lb (68 kg)  07/29/22 154 lb 1.6 oz (69.9 kg)    Physical Exam Vitals and nursing note reviewed.  Constitutional:      General: She is not in acute distress.    Appearance:  She is well-developed. She is not diaphoretic.     Comments: Well-appearing, comfortable, cooperative  HENT:     Head: Normocephalic and atraumatic.  Eyes:     General:        Right eye: No discharge.        Left eye: No discharge.     Conjunctiva/sclera: Conjunctivae normal.  Neck:     Thyroid: No thyromegaly.  Cardiovascular:     Rate and Rhythm: Normal rate and regular rhythm.     Heart sounds: Normal heart sounds. No murmur heard. Pulmonary:     Effort: Pulmonary effort is normal. No respiratory distress.     Breath sounds: Wheezing present. No rales.  Musculoskeletal:        General: Normal range of motion.     Cervical back: Normal range of motion and  neck supple.     Right lower leg: No edema.     Left lower leg: No edema.     Comments: Left hand with bulky DIP PIP joints, with some deformity with claw appearance fingers with some curling and stiffness  Lymphadenopathy:     Cervical: No cervical adenopathy.  Skin:    General: Skin is warm and dry.     Findings: No erythema or rash.  Neurological:     Mental Status: She is alert and oriented to person, place, and time.  Psychiatric:        Behavior: Behavior normal.     Comments: Well groomed, good eye contact, normal speech and thoughts     I have personally reviewed the radiology report from 11/10/22 on L Hand X-ray.  CLINICAL DATA:  Pain and stiffness.  Evaluate for arthritis.   EXAM: LEFT HAND - COMPLETE 3+ VIEW   COMPARISON:  Left hand radiographs 07/22/2020   FINDINGS: Moderate thumb carpometacarpal joint space narrowing and peripheral osteophytosis. Mild triscaphe joint space narrowing and peripheral osteophytosis. Mild thumb interphalangeal and mild-to-moderate second through fifth joint space narrowing and peripheral osteophytosis, greatest within the fourth DIP joint.   There is unchanged well-circumscribed lucency measuring approximately 9 mm within the distal lateral aspect of the proximal phalanx of the fourth finger, a possible enchondroma.   No acute fracture or dislocation.   IMPRESSION: 1. Moderate thumb carpometacarpal osteoarthritis. 2. Mild-to-moderate second through fifth DIP osteoarthritis.     Electronically Signed   By: Neita Garnet M.D.   On: 11/12/2022 16:57  I have personally reviewed the radiology report from 07/29/22 on CXR.  CLINICAL DATA:  COVID-19 positive.   EXAM: CHEST - 2 VIEW   COMPARISON:  None Available.   FINDINGS: Cardiac silhouette and mediastinal contours are within normal limits. Mild left costophrenic angle linear likely subsegmental atelectasis versus scarring. A single strain of similar atelectasis versus  scarring is seen overlying the right costophrenic angle. Mild hyperinflation. No pleural effusion or pneumothorax. Mild multilevel degenerative disc changes of the thoracic spine.   IMPRESSION: 1. Mild left and mild right costophrenic angle subsegmental atelectasis versus scarring. 2. Mild hyperinflation of the lungs compatible with COPD.     Electronically Signed   By: Neita Garnet M.D.   On: 07/29/2022 17:18  Results for orders placed or performed in visit on 11/10/22  Cologuard  Result Value Ref Range   COLOGUARD Negative Negative      Assessment & Plan:   Problem List Items Addressed This Visit     Centrilobular emphysema (HCC) - Primary    Clinically COPD Emphysema on history exam and  imaging CXR 07/2022 No other advanced imaging or PFT No prior therapy or maintenance inhaler  Sample Breztri today 2 puff TWICE A DAY, rx sent to pharmacy as well Rx Albuterol AS NEEDED rescue  Limit vaping in future if possible Follow-up, consider Pulm if indicated.      Relevant Medications   albuterol (VENTOLIN HFA) 108 (90 Base) MCG/ACT inhaler   BREZTRI AEROSPHERE 160-9-4.8 MCG/ACT AERO   Essential hypertension    Well-controlled HTN  No known complications    Plan:  1. Continue current BP regimen - Amlodipine 10mg  daily 2. Encourage improved lifestyle - low sodium diet, regular exercise 3. Start monitor BP outside office, bring readings to next visit, if persistently >140/90 or new symptoms notify office sooner      Primary osteoarthritis of both hands    Advanced osteoarthritis bilateral hands, L>R Due to repetitive activity, overuse, wear and tear X-rays 10/2022 confirms osteoarthritis degenerative process Failed most conservative therapy tylenol nsaid topical voltaren - temporary relief  May continue Meloxicam 15mg  daily, trial OFF for several days see if helping, may renew in future if successful, but ultimately goal is intermittently for flares. May continue Tylenol  and Voltaren regularly  Add Gabapentin dose titration today, gradual dose increase, limit sedation, can take in PM for sleep as well and eventually goal to trial during day if can tolerate  Consider future other med options - muscle relaxant, duloxetine   Future also consider refer to Ortho, injections, pain medication Tramadol as indicated.      Relevant Medications   gabapentin (NEURONTIN) 100 MG capsule   Other Visit Diagnoses     Chronic hand pain, left       Relevant Medications   gabapentin (NEURONTIN) 100 MG capsule   Recurrent epistaxis       Relevant Orders   Ambulatory referral to ENT   Family history of cerebral aneurysm       Relevant Orders   Ambulatory referral to Neurology   Frontal headache       Relevant Medications   gabapentin (NEURONTIN) 100 MG capsule   Other Relevant Orders   Ambulatory referral to Neurology         referral to ENT for recurrent epistaxis, and sensation in throat something stuck lump in throat, active smoker   referral to Neurologist for evaluation of concern for screening for cerebral aneurysm, she has strong fam history and episodic headaches, requesting eval   Orders Placed This Encounter  Procedures   Ambulatory referral to ENT    Referral Priority:   Routine    Referral Type:   Consultation    Referral Reason:   Specialty Services Required    Requested Specialty:   Otolaryngology    Number of Visits Requested:   1   Ambulatory referral to Neurology    Referral Priority:   Routine    Referral Type:   Consultation    Referral Reason:   Specialty Services Required    Requested Specialty:   Neurology    Number of Visits Requested:   1     Meds ordered this encounter  Medications   gabapentin (NEURONTIN) 100 MG capsule    Sig: Start 1 capsule daily at bedtime, increase by 1 cap every 3 days as tolerated up to 3 doses at bedtime, then can space out to up to 3 times per day if tolerated    Dispense:  90 capsule     Refill:  1   albuterol (VENTOLIN HFA)  108 (90 Base) MCG/ACT inhaler    Sig: Inhale 2 puffs into the lungs every 6 (six) hours as needed for wheezing or shortness of breath.    Dispense:  8 g    Refill:  5   BREZTRI AEROSPHERE 160-9-4.8 MCG/ACT AERO    Sig: Inhale 2 puffs into the lungs 2 (two) times daily.    Dispense:  10.7 g    Refill:  11     Follow up plan: Return in about 4 weeks (around 01/19/2023) for 4 week follow-up arthritis pain, breathing COPD updates, specialist updates.   Saralyn Pilar, DO [facility] 12/22/2022, 10:00 AM

## 2022-12-22 NOTE — Patient Instructions (Addendum)
Thank you for coming to the office today.  Referrals sent  [facility] - Neurology Dept [address] [place], [place] [address] Phone: [phone]  [facility] [address] [place], [place] [address] Phone: [phone]  -------------------  2 more refills on Ambien, 30 pills per bottle, next fill 6/7  Start Gabapentin 100mg  capsules, Start 1 capsule daily at bedtime, increase by 1 cap every 3 days as tolerated up to 3 doses at bedtime, then can space out to up to 3 times per day if tolerated  - In the future if needed, we can significantly increase the dose if tolerated well, some common doses are 300mg  three times a day up to 600mg  three times a day, usually it takes several weeks or months to get to higher doses  ----  Use Breztri inhaler 2 puffs twice a day - sample is for 1 week 7 days New rx sent  Albuterol rescue inhaler as needed every 4-6 hours, 2 puffs, only temporary relief.   Please schedule a Follow-up Appointment to: Return in about 4 weeks (around 01/19/2023) for 4 week follow-up arthritis pain, breathing COPD updates, specialist updates.  If you have any other questions or concerns, please feel free to call the office or send a message through MyChart. You may also schedule an earlier appointment if necessary.  Additionally, you may be receiving a survey about your experience at our office within a few days to 1 week by e-mail or mail. We value your feedback.  Saralyn Pilar, DO [facility], [place]

## 2022-12-22 NOTE — Assessment & Plan Note (Signed)
Advanced osteoarthritis bilateral hands, L>R Due to repetitive activity, overuse, wear and tear X-rays 10/2022 confirms osteoarthritis degenerative process Failed most conservative therapy tylenol nsaid topical voltaren - temporary relief  May continue Meloxicam 15mg  daily, trial OFF for several days see if helping, may renew in future if successful, but ultimately goal is intermittently for flares. May continue Tylenol and Voltaren regularly  Add Gabapentin dose titration today, gradual dose increase, limit sedation, can take in PM for sleep as well and eventually goal to trial during day if can tolerate  Consider future other med options - muscle relaxant, duloxetine   Future also consider refer to Ortho, injections, pain medication Tramadol as indicated.

## 2022-12-22 NOTE — Assessment & Plan Note (Signed)
Well-controlled HTN  No known complications    Plan:  1. Continue current BP regimen - Amlodipine 10mg  daily 2. Encourage improved lifestyle - low sodium diet, regular exercise 3. Start monitor BP outside office, bring readings to next visit, if persistently >140/90 or new symptoms notify office sooner

## 2023-01-19 ENCOUNTER — Ambulatory Visit (INDEPENDENT_AMBULATORY_CARE_PROVIDER_SITE_OTHER): Payer: BC Managed Care – PPO | Admitting: Family Medicine

## 2023-01-19 ENCOUNTER — Encounter: Payer: Self-pay | Admitting: Family Medicine

## 2023-01-19 VITALS — BP 138/78 | HR 87 | Resp 17 | Ht 62.0 in | Wt 150.2 lb

## 2023-01-19 DIAGNOSIS — G5602 Carpal tunnel syndrome, left upper limb: Secondary | ICD-10-CM

## 2023-01-19 DIAGNOSIS — R109 Unspecified abdominal pain: Secondary | ICD-10-CM

## 2023-01-19 DIAGNOSIS — M79642 Pain in left hand: Secondary | ICD-10-CM | POA: Diagnosis not present

## 2023-01-19 DIAGNOSIS — M19041 Primary osteoarthritis, right hand: Secondary | ICD-10-CM | POA: Diagnosis not present

## 2023-01-19 DIAGNOSIS — G8929 Other chronic pain: Secondary | ICD-10-CM

## 2023-01-19 DIAGNOSIS — M19042 Primary osteoarthritis, left hand: Secondary | ICD-10-CM

## 2023-01-19 MED ORDER — TRAMADOL HCL 50 MG PO TABS: 50.0000 mg | ORAL_TABLET | Freq: Four times a day (QID) | ORAL | 0 refills | Status: DC | PRN

## 2023-01-19 MED ORDER — DICYCLOMINE HCL 10 MG PO CAPS: 10.0000 mg | ORAL_CAPSULE | Freq: Three times a day (TID) | ORAL | 0 refills | Status: DC

## 2023-01-19 NOTE — Progress Notes (Signed)
Subjective:    Patient ID: Gloria Perkins, female    DOB: 07/17/1966, 57 y.o.   MRN: [id number]  Gloria Perkins is a 57 y.o. female presenting on 01/19/2023 for Arthritis (Pt complains of bilateral hand pains, pt reports no improvement with the pain with taking the Gabapentin ), Abdominal Pain (Intermittent Rt side abdominal pain x several months ), and COPD (F/u on breathing )   HPI  Arthritis hands bilateral Left Carpal Tunnel Suspected Recent course for 3+ months issue with hand pain and arthritis Stopped Gabapentin, took several without relief Still having pain at night  Consider new job position Tried Meloxicam Gabapentin Voltaren topical Tylenol without relief Fam history of arthritis Improved in past from Prednisone taper temporarily Numbness in Left hand and claw shape deformity, stuck and firm  Headaches resolved Not eating pork or bacon anymore  Abdominal Pain Right Abdominal Pain History of PID - referenced recent STD testing full panel from HD 09/2022 Episodic flare 2 to 3 times a week, can be episodic, and gone for weeks. Not sure any relation to eating / drinking Regular BMs. Not having any issues. Admits some bloating symptoms  CHRONIC HTN Reports improved on medication Current Meds - Amlodipine 10mg  daily   Reports good compliance, took meds today. Tolerating well, w/o complaints. Admits frontal headache Denies CP, dyspnea, edema, dizziness / lightheadedness  Bipolar Type 1 episode Uncertain if long term diagnosis, was dx previously due to acute stressors with loss of husband and children. Has improved, no longer w/ Psychiatry or mental health now. Also tried Remeron, Latuda, Abilify - off of these meds now doing better No longer on medication, has done well   Insomnia Headaches Frontal description, episodic can last hours, onset within past 1 month, seems related to poor sleep Now difficulty sleeping due to hand pain and just not sleeping well. Likely from  problem with headaches, Tried Excedrin without relief Tried Trazodone and Ambien in past. Trazodone failed. Improved on Ambien PRN       11/10/2022    1:07 PM  Depression screen PHQ 2/9  Decreased Interest 1  Down, Depressed, Hopeless 1  PHQ - 2 Score 2  Altered sleeping 3  Tired, decreased energy 1  Change in appetite 0  Feeling bad or failure about yourself  0  Trouble concentrating 1  Moving slowly or fidgety/restless 0  Suicidal thoughts 0  PHQ-9 Score 7    Social History   Tobacco Use   Smoking status: Every Day    Types: E-cigarettes   Smokeless tobacco: Never  Vaping Use   Vaping Use: Former   Substances: Nicotine, Flavoring  Substance Use Topics   Alcohol use: Not Currently    Comment: last use 2021   Drug use: Yes    Types: Marijuana    Comment: last use 08/22/22    Review of Systems Per HPI unless specifically indicated above     Objective:    BP (!) 144/78 (BP Location: Right Arm, Patient Position: Sitting, Cuff Size: Normal)   Pulse 87   Resp 17   Ht 5\' 2"  (1.575 m)   Wt 150 lb 3.2 oz (68.1 kg)   LMP  (LMP Unknown)   SpO2 99%   BMI 27.47 kg/m   Wt Readings from Last 3 Encounters:  01/19/23 150 lb 3.2 oz (68.1 kg)  12/22/22 148 lb (67.1 kg)  11/10/22 150 lb (68 kg)    Physical Exam Vitals and nursing note reviewed.  Constitutional:  General: She is not in acute distress.    Appearance: Normal appearance. She is well-developed. She is not diaphoretic.     Comments: Well-appearing, comfortable, cooperative  HENT:     Head: Normocephalic and atraumatic.  Eyes:     General:        Right eye: No discharge.        Left eye: No discharge.     Conjunctiva/sclera: Conjunctivae normal.  Cardiovascular:     Rate and Rhythm: Normal rate.  Pulmonary:     Effort: Pulmonary effort is normal.  Abdominal:     General: There is no distension.     Palpations: There is no mass.     Tenderness: There is no abdominal tenderness. There is no  guarding.     Hernia: No hernia is present.     Comments: Hyperactive bowel sounds  Skin:    General: Skin is warm and dry.     Findings: No erythema or rash.  Neurological:     Mental Status: She is alert and oriented to person, place, and time.  Psychiatric:        Mood and Affect: Mood normal.        Behavior: Behavior normal.        Thought Content: Thought content normal.     Comments: Well groomed, good eye contact, normal speech and thoughts     I have personally reviewed the radiology report from 11/12/22 on X-ray Left Hand.  DG Hand Complete Left [[id number]] Resulted: 11/12/22 1657  Order Status: Completed Updated: 11/12/22 1803  Narrative:    CLINICAL DATA:  Pain and stiffness.  Evaluate for arthritis.  EXAM: LEFT HAND - COMPLETE 3+ VIEW  COMPARISON:  Left hand radiographs 07/22/2020  FINDINGS: Moderate thumb carpometacarpal joint space narrowing and peripheral osteophytosis. Mild triscaphe joint space narrowing and peripheral osteophytosis. Mild thumb interphalangeal and mild-to-moderate second through fifth joint space narrowing and peripheral osteophytosis, greatest within the fourth DIP joint.  There is unchanged well-circumscribed lucency measuring approximately 9 mm within the distal lateral aspect of the proximal phalanx of the fourth finger, a possible enchondroma.  No acute fracture or dislocation.  IMPRESSION: 1. Moderate thumb carpometacarpal osteoarthritis. 2. Mild-to-moderate second through fifth DIP osteoarthritis.   Electronically Signed   By: Neita Garnet M.D.   On: 11/12/2022 16:57    Results for orders placed or performed in visit on 11/10/22  Cologuard  Result Value Ref Range   COLOGUARD Negative Negative      Assessment & Plan:   Problem List Items Addressed This Visit     Primary osteoarthritis of both hands   Relevant Medications   traMADol (ULTRAM) 50 MG tablet   Other Visit Diagnoses     Carpal tunnel syndrome on  left    -  Primary   Relevant Medications   traMADol (ULTRAM) 50 MG tablet   Chronic hand pain, left       Relevant Medications   traMADol (ULTRAM) 50 MG tablet   Abdominal cramping       Relevant Medications   dicyclomine (BENTYL) 10 MG capsule       Osteoarthritis / Suspected Carpal Tunnel - Left hand  Clinically most consistent with chronic gradual worsening L hand carpal tunnel syndrome - Suspected secondary to chronic repetitive overuse - Limited improvement conservative therapy  Plan: 1. Discussed course of carpal tunnel syndrome, treatment, prognosis, complications 2. Tramadol rx AS NEEDED breakthrough pain 3. START  wrist splint support (avoid flex/ext repetition)  wear overnight to bed, may use during day with excessive or repetitive activities  Future reconsider Prednisone burst  Referral to [facility] for NCS and evaluation consult, may need injection or other approach - possibly future referral to Ortho for surgical release if diagnostic given chronicity of problem  Abdominal Pain / Cramping, Functional GI symptoms Limited history on this issue but seems not provoked by BY MOUTH intake Has gas bloating function GI issues it seems Recommend OTC Peppermint Oil Take Dicyclomine as needed for cramping 3-4 times per day IF NEEDED ONLY.   Meds ordered this encounter  Medications   traMADol (ULTRAM) 50 MG tablet    Sig: Take 1 tablet (50 mg total) by mouth every 6 (six) hours as needed.    Dispense:  20 tablet    Refill:  0   dicyclomine (BENTYL) 10 MG capsule    Sig: Take 1 capsule (10 mg total) by mouth 4 (four) times daily -  before meals and at bedtime. As needed for abdominal pain cramping    Dispense:  30 capsule    Refill:  0     Follow up plan: Return if symptoms worsen or fail to improve.   Saralyn Pilar, DO [facility] 01/19/2023, 3:52 PM

## 2023-01-19 NOTE — Patient Instructions (Addendum)
Thank you for coming to the office today.  Left Hand Wrist Suspected Carpal Tunnel Syndrome and we will try to coordinate with the Neurologist for July 10th so they can evaluate it.  You most likely have Carpal Tunnel Syndrome of Left hand/wrist based on your symptoms. This a problem of compression on the nerve entering the hand at the wrist. Often it is caused by long history of overuse or repetitive activities that put strain on the nerve within wrist. Occasionally this can be caused by swelling or weight gain and pressure on this nerve as well.  Try the Tramadol pain pill as needed in the evening preferred  Try to wear a wrist brace / splint to avoid bending the wrist at night.  Consider steroid injection, other therapy and possibly referral to Orthopedic surgery for possible carpal tunnel release surgery as mentioned  For abdominal pain symptoms  Take Dicyclomine as needed for cramping 3-4 times per day IF NEEDED ONLY.   Please schedule a Follow-up Appointment to: Return if symptoms worsen or fail to improve.  If you have any other questions or concerns, please feel free to call the office or send a message through MyChart. You may also schedule an earlier appointment if necessary.  Additionally, you may be receiving a survey about your experience at our office within a few days to 1 week by e-mail or mail. We value your feedback.  Saralyn Pilar, DO [facility], [place]

## 2023-02-04 ENCOUNTER — Ambulatory Visit: Payer: Self-pay | Admitting: *Deleted

## 2023-02-04 DIAGNOSIS — G5602 Carpal tunnel syndrome, left upper limb: Secondary | ICD-10-CM

## 2023-02-04 DIAGNOSIS — G8929 Other chronic pain: Secondary | ICD-10-CM

## 2023-02-04 DIAGNOSIS — M19041 Primary osteoarthritis, right hand: Secondary | ICD-10-CM

## 2023-02-04 NOTE — Telephone Encounter (Signed)
Pt called regarding an update regarding medication. She states that if this is not addressed today she will have no medication for the weekend.

## 2023-02-04 NOTE — Telephone Encounter (Signed)
Summary: Pt request Rx dose to be increased   Pt requests that the Rx for traMADol (ULTRAM) 50 MG tablet dose be increased or either prescribe more pills until she can be seen by the neurologist. Pt stated she is at work and will have her phone off. Pt requests to be contacted after 4 pm. Cb# [phone]       Chief Complaint: medication dose increase for ultram and refill for 1 month until seen by neurologist Symptoms: missed appt due to no transportation and requesting increase dose of ultram until seen by neurologist in 1 month.  Frequency: na  Pertinent Negatives: Patient denies na Disposition: [] ED /[] Urgent Care (no appt availability in office) / [] Appointment(In office/virtual)/ []  [place] [facility]/ [] Home Care/ [] Refused Recommended Disposition /[] [place] [facility]/ [x]  Follow-up with PCP Additional Notes:   Poor reception communicating with patient and unable to hear conversation. Patient reports she missed appt due to no transportation and will not see neurologist for 1 month. Requesting refills until then and to increase dose.        Reason for Disposition  Caller requesting a CONTROLLED substance prescription refill (e.g., narcotics, ADHD medicines)  Answer Assessment - Initial Assessment Questions 1. DRUG NAME: "What medicine do you need to have refilled?"     Ultram 50 mg  2. REFILLS REMAINING: "How many refills are remaining?" (Note: The label on the medicine or pill bottle will show how many refills are remaining. If there are no refills remaining, then a renewal may be needed.)     Na  3. EXPIRATION DATE: "What is the expiration date?" (Note: The label states when the prescription will expire, and thus can no longer be refilled.)     Na  4. PRESCRIBING HCP: "Who prescribed it?" Reason: If prescribed by specialist, call should be referred to that group.     PCP 5. SYMPTOMS: "Do you have any symptoms?"     Requesting increase in dose and 1 month supply  until seen by neurologist in 1 month 6. PREGNANCY: "Is there any chance that you are pregnant?" "When was your last menstrual period?"     na  Protocols used: Medication Refill and Renewal Call-A-AH

## 2023-02-07 MED ORDER — TRAMADOL HCL 50 MG PO TABS: 50.0000 mg | ORAL_TABLET | Freq: Four times a day (QID) | ORAL | 0 refills | Status: DC | PRN

## 2023-02-07 NOTE — Addendum Note (Signed)
Addended by: Kavin Leech E on: 02/07/2023 10:54 AM   Modules accepted: Orders

## 2023-02-15 ENCOUNTER — Telehealth: Payer: Self-pay

## 2023-02-15 DIAGNOSIS — G5602 Carpal tunnel syndrome, left upper limb: Secondary | ICD-10-CM

## 2023-02-15 DIAGNOSIS — M19042 Primary osteoarthritis, left hand: Secondary | ICD-10-CM

## 2023-02-15 DIAGNOSIS — G8929 Other chronic pain: Secondary | ICD-10-CM

## 2023-02-15 NOTE — Telephone Encounter (Signed)
Patient walked in requesting a refill of Tramadol.  She reported she is taking 4-5 pills a day.  She had missed her referral appointment and now has is scheduled on 7/30.  She is requesting another refill until she gets to that appointment.

## 2023-02-16 MED ORDER — TRAMADOL HCL 50 MG PO TABS: 50.0000 mg | ORAL_TABLET | Freq: Four times a day (QID) | ORAL | 0 refills | Status: DC | PRN

## 2023-02-16 NOTE — Telephone Encounter (Signed)
Okay, I will sign a re order for Tramadol, up to max 4 pills in 24 hours, prefer to reduce dose to around 2 to 3 per day if tolerable, otherwise she can take as needed until 02/22/23  Saralyn Pilar, DO [facility] 02/16/2023, 1:28 PM

## 2023-02-16 NOTE — Telephone Encounter (Signed)
Patient aware of refill.

## 2023-02-22 DIAGNOSIS — M79642 Pain in left hand: Secondary | ICD-10-CM | POA: Diagnosis not present

## 2023-02-22 DIAGNOSIS — R519 Headache, unspecified: Secondary | ICD-10-CM | POA: Diagnosis not present

## 2023-02-22 DIAGNOSIS — M79602 Pain in left arm: Secondary | ICD-10-CM | POA: Diagnosis not present

## 2023-02-23 ENCOUNTER — Other Ambulatory Visit: Payer: Self-pay | Admitting: Student

## 2023-02-23 DIAGNOSIS — R519 Headache, unspecified: Secondary | ICD-10-CM

## 2023-03-07 ENCOUNTER — Ambulatory Visit: Payer: Self-pay

## 2023-03-07 DIAGNOSIS — G8929 Other chronic pain: Secondary | ICD-10-CM

## 2023-03-07 DIAGNOSIS — F5101 Primary insomnia: Secondary | ICD-10-CM

## 2023-03-07 DIAGNOSIS — M19042 Primary osteoarthritis, left hand: Secondary | ICD-10-CM

## 2023-03-07 DIAGNOSIS — G5602 Carpal tunnel syndrome, left upper limb: Secondary | ICD-10-CM

## 2023-03-07 NOTE — Telephone Encounter (Signed)
  Chief Complaint: left hand pain  Symptoms: severe pain  Frequency: chronic Disposition: [] ED /[] Urgent Care (no appt availability in office) / [] Appointment(In office/virtual)/ []  [place] [facility]/ [] Home Care/ [] Refused Recommended Disposition /[] [place] Mobile Bus/ []  Follow-up with PCP Additional Notes: pt asking for appt for severe pain. Advised pt there are no appt's until 03/14/23. Pt under neurologist care  for issue and is on Lyrica Pt was advised by neurologist to call there for no improvement  Pt did not call there. Call transferred to her office.  Can pt get a sooner appt to see PCP? Reason for Disposition  [1] SEVERE pain (e.g., excruciating, unable to use hand at all) AND [2] not improved after 2 hours of pain medicine  Answer Assessment - Initial Assessment Questions 1. ONSET: "When did the pain start?"     chronic 2. LOCATION: "Where is the pain located?"     Left hand 3. PAIN: "How bad is the pain?" (Scale 1-10; or mild, moderate, severe)   - MILD (1-3): doesn't interfere with normal activities   - MODERATE (4-7): interferes with normal activities (e.g., work or school) or awakens from sleep   - SEVERE (8-10): excruciating pain, unable to use hand at all     severe  Protocols used: Hand and Wrist Pain-A-AH

## 2023-03-08 ENCOUNTER — Other Ambulatory Visit: Payer: Self-pay | Admitting: Family Medicine

## 2023-03-08 DIAGNOSIS — F5101 Primary insomnia: Secondary | ICD-10-CM

## 2023-03-08 MED ORDER — ZOLPIDEM TARTRATE 10 MG PO TABS: 10.0000 mg | ORAL_TABLET | Freq: Every evening | ORAL | 2 refills | Status: DC | PRN

## 2023-03-08 MED ORDER — TRAMADOL HCL 50 MG PO TABS: 50.0000 mg | ORAL_TABLET | Freq: Four times a day (QID) | ORAL | 0 refills | Status: DC

## 2023-03-08 NOTE — Telephone Encounter (Signed)
Called patient. Re order Tramadol for FOUR TIMES A DAY dosing 30 day supply, she may have to cover cost. Re order Ambien 10mg  dose, she ran out, did not request refills. Referral to [facility] for L hand pain, arthritis eval. Pending EMG L hand per Neuro 03/2023.  Saralyn Pilar, DO [facility] 03/08/2023, 4:59 PM

## 2023-03-08 NOTE — Telephone Encounter (Signed)
Pt called regarding follow up on hand pain. Called fc - and was able to forward call to fc.

## 2023-03-08 NOTE — Telephone Encounter (Signed)
Patient says lyrica is not helping.  She has stopped taking them.  She is in a lot of pain and cannot keep going to the emergency room.  Advised patient to follow up with neurology but she wanted to get Dr Althea Charon opinion.

## 2023-03-08 NOTE — Addendum Note (Signed)
Addended by: Smitty Cords on: 03/08/2023 04:59 PM   Modules accepted: Orders

## 2023-03-10 NOTE — Telephone Encounter (Signed)
Requested medication (s) are due for refill today - no  Requested medication (s) are on the active medication list --yes  Future visit scheduled -no  Last refill: 03/08/23 #30 2RF  Notes to clinic: non delegated Rx  Requested Prescriptions  Pending Prescriptions Disp Refills   zolpidem (AMBIEN) 10 MG tablet [Pharmacy Med Name: ZOLPIDEM TARTRATE 10 MG TABLET] 30 tablet 2    Sig: TAKE 1 TABLET BY MOUTH AT BEDTIME AS NEEDED FOR SLEEP.     Not Delegated - Psychiatry:  Anxiolytics/Hypnotics Failed - 03/08/2023  5:12 PM      Failed - This refill cannot be delegated      Failed - Urine Drug Screen completed in last 360 days      Passed - Valid encounter within last 6 months    Recent Outpatient Visits           1 month ago Carpal tunnel syndrome on left   [place] [facility] [place], Netta Neat, DO   2 months ago Centrilobular emphysema [facility])   [facility] Smitty Cords, DO   4 months ago Essential hypertension   [place] [facility] Smitty Cords, DO                 Requested Prescriptions  Pending Prescriptions Disp Refills   zolpidem (AMBIEN) 10 MG tablet [Pharmacy Med Name: ZOLPIDEM TARTRATE 10 MG TABLET] 30 tablet 2    Sig: TAKE 1 TABLET BY MOUTH AT BEDTIME AS NEEDED FOR SLEEP.     Not Delegated - Psychiatry:  Anxiolytics/Hypnotics Failed - 03/08/2023  5:12 PM      Failed - This refill cannot be delegated      Failed - Urine Drug Screen completed in last 360 days      Passed - Valid encounter within last 6 months    Recent Outpatient Visits           1 month ago Carpal tunnel syndrome on left   [facility] Smitty Cords, DO   2 months ago Centrilobular emphysema [facility])   [place] [facility] Smitty Cords, DO   4 months ago Essential hypertension   [place] [facility] [place],  Netta Neat, [place]

## 2023-03-14 ENCOUNTER — Other Ambulatory Visit: Payer: Self-pay | Admitting: Family Medicine

## 2023-03-14 DIAGNOSIS — M19041 Primary osteoarthritis, right hand: Secondary | ICD-10-CM

## 2023-03-15 NOTE — Telephone Encounter (Signed)
Requested Prescriptions  Pending Prescriptions Disp Refills   meloxicam (MOBIC) 15 MG tablet [Pharmacy Med Name: MELOXICAM 15 MG TABLET] 30 tablet 3    Sig: TAKE 1 TABLET (15 MG TOTAL) BY MOUTH DAILY.     Analgesics:  COX2 Inhibitors Failed - 03/14/2023  1:35 AM      Failed - Manual Review: Labs are only required if the patient has taken medication for more than 8 weeks.      Failed - AST in normal range and within 360 days    AST  Date Value Ref Range Status  06/22/2022 78 (H) 15 - 41 U/L Final         Failed - ALT in normal range and within 360 days    ALT  Date Value Ref Range Status  06/22/2022 62 (H) 0 - 44 U/L Final         Passed - HGB in normal range and within 360 days    Hemoglobin  Date Value Ref Range Status  07/29/2022 14.0 12.0 - 15.0 g/dL Final         Passed - Cr in normal range and within 360 days    Creatinine, Ser  Date Value Ref Range Status  07/29/2022 0.68 0.44 - 1.00 mg/dL Final         Passed - HCT in normal range and within 360 days    HCT  Date Value Ref Range Status  07/29/2022 40.6 36.0 - 46.0 % Final         Passed - eGFR is 30 or above and within 360 days    GFR, Estimated  Date Value Ref Range Status  07/29/2022 >60 >60 mL/min Final    Comment:    (NOTE) Calculated using the CKD-EPI Creatinine Equation (2021)          Passed - Patient is not pregnant      Passed - Valid encounter within last 12 months    Recent Outpatient Visits           1 month ago Carpal tunnel syndrome on left   [facility], [place] J, DO   2 months ago Centrilobular emphysema [facility])   [place] [facility] [place], Netta Neat, DO   4 months ago Essential hypertension   [place] [facility] [place], Netta Neat, [place]

## 2023-03-21 DIAGNOSIS — G5603 Carpal tunnel syndrome, bilateral upper limbs: Secondary | ICD-10-CM | POA: Diagnosis not present

## 2023-04-25 ENCOUNTER — Other Ambulatory Visit: Payer: Self-pay | Admitting: Family Medicine

## 2023-04-25 DIAGNOSIS — M19041 Primary osteoarthritis, right hand: Secondary | ICD-10-CM

## 2023-04-25 DIAGNOSIS — G8929 Other chronic pain: Secondary | ICD-10-CM

## 2023-04-25 DIAGNOSIS — F5101 Primary insomnia: Secondary | ICD-10-CM

## 2023-04-25 DIAGNOSIS — G5602 Carpal tunnel syndrome, left upper limb: Secondary | ICD-10-CM

## 2023-04-25 NOTE — Telephone Encounter (Signed)
Medication Refill - Medication: traMADol (ULTRAM) 50 MG tablet  zolpidem (AMBIEN) 10 MG tablet  Has the patient contacted their pharmacy? Yes.   (Agent: If no, request that the patient contact the pharmacy for the refill. If patient does not wish to contact the pharmacy document the reason why and proceed with request.) (Agent: If yes, when and what did the pharmacy advise?)  Preferred Pharmacy (with phone number or street name):  [id number] - [place], [place] - [address]  [address] [place] [address]  Phone: [phone] Fax: [phone]   Has the patient been seen for an appointment in the last year OR does the patient have an upcoming appointment? Yes.    Agent: Please be advised that RX refills may take up to 3 business days. We ask that you follow-up with your pharmacy.

## 2023-04-26 MED ORDER — TRAMADOL HCL 50 MG PO TABS
50.0000 mg | ORAL_TABLET | Freq: Four times a day (QID) | ORAL | 0 refills | Status: DC
Start: 2023-04-26 — End: 2023-06-01

## 2023-04-26 MED ORDER — ZOLPIDEM TARTRATE 10 MG PO TABS
10.0000 mg | ORAL_TABLET | Freq: Every evening | ORAL | 2 refills | Status: DC | PRN
Start: 2023-04-26 — End: 2023-09-13

## 2023-04-26 NOTE — Telephone Encounter (Signed)
Requested medications are due for refill today.  Tramadol - yes, zolpidem - no  Requested medications are on the active medications list.  yes  Last refill. Both 03/08/2023  Future visit scheduled.   no  Notes to clinic.  Refill/refusal not delegated.    Requested Prescriptions  Pending Prescriptions Disp Refills   traMADol (ULTRAM) 50 MG tablet 120 tablet 0    Sig: Take 1 tablet (50 mg total) by mouth 4 (four) times daily.     Not Delegated - Analgesics:  Opioid Agonists Failed - 04/25/2023  2:46 PM      Failed - This refill cannot be delegated      Failed - Urine Drug Screen completed in last 360 days      Failed - Valid encounter within last 3 months    Recent Outpatient Visits           3 months ago Carpal tunnel syndrome on left   [facility] [place], Netta Neat, DO   4 months ago Centrilobular emphysema [facility])   [place] [facility] Smitty Cords, DO   5 months ago Essential hypertension   [place] [facility] [place], Netta Neat, DO               zolpidem (AMBIEN) 10 MG tablet 30 tablet 2    Sig: Take 1 tablet (10 mg total) by mouth at bedtime as needed for sleep.     Not Delegated - Psychiatry:  Anxiolytics/Hypnotics Failed - 04/25/2023  2:46 PM      Failed - This refill cannot be delegated      Failed - Urine Drug Screen completed in last 360 days      Passed - Valid encounter within last 6 months    Recent Outpatient Visits           3 months ago Carpal tunnel syndrome on left   [facility] [place], Netta Neat, DO   4 months ago Centrilobular emphysema [facility])   [place] [facility] Smitty Cords, DO   5 months ago Essential hypertension   [place] [facility] [place], Netta Neat, [place]

## 2023-06-01 ENCOUNTER — Other Ambulatory Visit: Payer: Self-pay

## 2023-06-01 DIAGNOSIS — M19041 Primary osteoarthritis, right hand: Secondary | ICD-10-CM

## 2023-06-01 DIAGNOSIS — G5602 Carpal tunnel syndrome, left upper limb: Secondary | ICD-10-CM

## 2023-06-01 DIAGNOSIS — G8929 Other chronic pain: Secondary | ICD-10-CM

## 2023-06-01 MED ORDER — TRAMADOL HCL 50 MG PO TABS: 50.0000 mg | ORAL_TABLET | Freq: Four times a day (QID) | ORAL | 2 refills | Status: DC

## 2023-06-27 ENCOUNTER — Ambulatory Visit: Payer: Self-pay

## 2023-06-27 DIAGNOSIS — G8929 Other chronic pain: Secondary | ICD-10-CM

## 2023-06-27 DIAGNOSIS — G5602 Carpal tunnel syndrome, left upper limb: Secondary | ICD-10-CM

## 2023-06-27 DIAGNOSIS — M19041 Primary osteoarthritis, right hand: Secondary | ICD-10-CM

## 2023-06-27 MED ORDER — TRAMADOL HCL 50 MG PO TABS: 50.0000 mg | ORAL_TABLET | Freq: Four times a day (QID) | ORAL | 2 refills | Status: DC

## 2023-06-27 NOTE — Telephone Encounter (Signed)
Chief Complaint: Hand Pain  Symptoms: bilateral hand pain up to the shoulder Frequency: constant  Pertinent Negatives: Patient denies new pain  Disposition: [] ED /[] Urgent Care (no appt availability in office) / [x] Appointment(In office/virtual)/ []  [place] [facility]/ [] Home Care/ [] Refused Recommended Disposition /[] [place] [facility]/ []  Follow-up with PCP Additional Notes: Patient stated she continues to have chronic hand pain and the left is hurting more than the right. Patient states the tramadol does not seem to be helping much anymore but she is completely out and needs a refill. Patient wants to discuss other options for the hand pain. Care advice given and patient has been scheduled for an office visit on 07/06/23.   Reason for Disposition  Hand or wrist pain is a chronic symptom (recurrent or ongoing AND present > 4 weeks)  Answer Assessment - Initial Assessment Questions 1. ONSET: "When did the pain start?"     Chronic Pain  2. LOCATION: "Where is the pain located?"     Bilateral hands, worse in the left hand  3. PAIN: "How bad is the pain?" (Scale 1-10; or mild, moderate, severe)   - MILD (1-3): doesn't interfere with normal activities   - MODERATE (4-7): interferes with normal activities (e.g., work or school) or awakens from sleep   - SEVERE (8-10): excruciating pain, unable to use hand at all     10/10 4. WORK OR EXERCISE: "Has there been any recent work or exercise that involved this part (i.e., hand or wrist) of the body?"     No  5. CAUSE: "What do you think is causing the pain?"     Carpal tunnel in the hands  6. AGGRAVATING FACTORS: "What makes the pain worse?" (e.g., using computer)     When I'm working  7. OTHER SYMPTOMS: "Do you have any other symptoms?" (e.g., neck pain, swelling, rash, numbness, fever)     Swelling and numbness  Protocols used: Hand and Wrist Pain-A-AH

## 2023-06-27 NOTE — Telephone Encounter (Signed)
Requested medication (s) are due for refill today: Yes  Requested medication (s) are on the active medication list: Yes  Last refill:  06/01/23   Future visit scheduled: Yes  Notes to clinic:  Unable to refill per protocol, cannot delegate.      Requested Prescriptions  Pending Prescriptions Disp Refills   traMADol (ULTRAM) 50 MG tablet 120 tablet 2    Sig: Take 1 tablet (50 mg total) by mouth 4 (four) times daily.     Not Delegated - Analgesics:  Opioid Agonists Failed - 06/27/2023  4:10 PM      Failed - This refill cannot be delegated      Failed - Urine Drug Screen completed in last 360 days      Failed - Valid encounter within last 3 months    Recent Outpatient Visits           5 months ago Carpal tunnel syndrome on left   [facility] Smitty Cords, DO   6 months ago Centrilobular emphysema [facility])   [facility] Smitty Cords, DO   7 months ago Essential hypertension   [facility] Smitty Cords, DO       Future Appointments             In 1 week Althea Charon, Netta Neat, DO [facility] [place] [facility]

## 2023-07-06 ENCOUNTER — Ambulatory Visit: Payer: BC Managed Care – PPO | Admitting: Family Medicine

## 2023-07-06 ENCOUNTER — Encounter: Payer: Self-pay | Admitting: Family Medicine

## 2023-07-06 VITALS — BP 140/88 | HR 78 | Ht 62.0 in | Wt 153.0 lb

## 2023-07-06 DIAGNOSIS — M79609 Pain in unspecified limb: Secondary | ICD-10-CM | POA: Diagnosis not present

## 2023-07-06 DIAGNOSIS — M19041 Primary osteoarthritis, right hand: Secondary | ICD-10-CM

## 2023-07-06 DIAGNOSIS — G4486 Cervicogenic headache: Secondary | ICD-10-CM | POA: Diagnosis not present

## 2023-07-06 DIAGNOSIS — G5602 Carpal tunnel syndrome, left upper limb: Secondary | ICD-10-CM

## 2023-07-06 DIAGNOSIS — M19042 Primary osteoarthritis, left hand: Secondary | ICD-10-CM

## 2023-07-06 DIAGNOSIS — R202 Paresthesia of skin: Secondary | ICD-10-CM

## 2023-07-06 MED ORDER — PREDNISONE 20 MG PO TABS: ORAL_TABLET | ORAL | 0 refills | Status: DC

## 2023-07-06 MED ORDER — PREGABALIN 100 MG PO CAPS: 100.0000 mg | ORAL_CAPSULE | Freq: Two times a day (BID) | ORAL | 0 refills | Status: DC

## 2023-07-06 NOTE — Patient Instructions (Addendum)
Thank you for coming to the office today.  Start Lyrica generic pregabalin 100mg  twice a day, if you have the older version 25mg  you can mix and match, do 100mg  in AM and 25-50 in PM or vice versa.  Start the Prednisone taper 12 day, every 4 days reduce the dose.  If still needing assistance, we can order other medications such as a muscle relaxants, or stronger pain pills.  I can write the medical excuse for the apt for the testing.  Please schedule a Follow-up Appointment to: Return if symptoms worsen or fail to improve.  If you have any other questions or concerns, please feel free to call the office or send a message through MyChart. You may also schedule an earlier appointment if necessary.  Additionally, you may be receiving a survey about your experience at our office within a few days to 1 week by e-mail or mail. We value your feedback.  Saralyn Pilar, DO [facility], [place]

## 2023-07-06 NOTE — Progress Notes (Signed)
Subjective:    Patient ID: Gloria Perkins, female    DOB: 1965/12/25, 57 y.o.   MRN: [id number]  Gloria Perkins is a 57 y.o. female presenting on 07/06/2023 for Acute Visit (Pain in wrist and hand, tingling in arm left, loss of feeling in hand)   HPI  Discussed the use of AI scribe software for clinical note transcription with the patient, who gave verbal consent to proceed.  Left Carpal Tunnel Syndrome Neck Pain, Cervicogenic Headaches Paresthesia Left Arm  Background history - Last visit with me 01/19/23 with me for Arthritis hands and R Carpal Tunnel, initially had R thumb, index and middle finger and wrist pain. Diagnosed clinically and given treatment course with Tramadol, wrist splint, referral to Neurology.  She has established with [facility] already, last seen 02/22/23 for initial consult, and setup with [facility] on 03/21/23. She had cortisone injection with temporary relief.  The patient, with a history of left carpal tunnel syndrome and left arm nerve impingement, presents with worsening pain in the left hand and arm. The pain, initially localized to the thumb, index, and middle fingers, has now extended to involve the entire hand and arm up to the shoulder. The patient describes the pain as a sensation of stiffness, puffiness, and tingling, likening it to a lack of blood circulation and the feeling of the hand 'blowing up.' The pain is severe enough to cause sleep disturbances and emotional distress.  The patient reports a temporary relief from pain following a cortisone injection, which lasted for about a month and a half. However, the pain returned and has since worsened. The patient has tried tramadol for pain management, but it has proven ineffective, leading to discontinuation. The patient has also tried Lyrica 25-50mg  BID and gabapentin, neither of which provided significant relief. Topical treatments, including lidocaine and Tiger Balm, have provided temporary  relief but are not long-lasting. -Note she did not increase dose of Lyrica as advised.  Additionally, Neurology ordered EMG + MRI. She has been unable to schedule these tests yet due to unable to get time off work.  The patient lives alone and has experienced significant emotional distress due to the pain, which she describes as debilitating.          07/06/2023    3:43 PM 11/10/2022    1:07 PM  Depression screen PHQ 2/9  Decreased Interest 3 1  Down, Depressed, Hopeless 3 1  PHQ - 2 Score 6 2  Altered sleeping 3 3  Tired, decreased energy 2 1  Change in appetite 0 0  Feeling bad or failure about yourself  2 0  Trouble concentrating 0 1  Moving slowly or fidgety/restless 0 0  Suicidal thoughts 0 0  PHQ-9 Score 13 7       07/06/2023    3:43 PM  GAD 7 : Generalized Anxiety Score  Nervous, Anxious, on Edge 1  Control/stop worrying 0  Worry too much - different things 0  Trouble relaxing 3  Restless 0  Easily annoyed or irritable 1  Afraid - awful might happen 0  Total GAD 7 Score 5    Social History   Tobacco Use   Smoking status: Every Day    Types: E-cigarettes   Smokeless tobacco: Never  Vaping Use   Vaping status: Former   Substances: Nicotine, Flavoring  Substance Use Topics   Alcohol use: Not Currently    Comment: last use 2021   Drug use: Yes    Types: Marijuana  Comment: last use 08/22/22    Review of Systems Per HPI unless specifically indicated above     Objective:    BP (!) 140/88 (BP Location: Left Arm, Cuff Size: Normal)   Pulse 78   Ht 5\' 2"  (1.575 m)   Wt 153 lb (69.4 kg)   LMP  (LMP Unknown)   BMI 27.98 kg/m   Wt Readings from Last 3 Encounters:  07/06/23 153 lb (69.4 kg)  01/19/23 150 lb 3.2 oz (68.1 kg)  12/22/22 148 lb (67.1 kg)    Physical Exam Vitals and nursing note reviewed.  Constitutional:      General: She is not in acute distress.    Appearance: Normal appearance. She is well-developed. She is not diaphoretic.      Comments: Well-appearing, comfortable, cooperative  HENT:     Head: Normocephalic and atraumatic.  Eyes:     General:        Right eye: No discharge.        Left eye: No discharge.     Conjunctiva/sclera: Conjunctivae normal.  Cardiovascular:     Rate and Rhythm: Normal rate.  Pulmonary:     Effort: Pulmonary effort is normal.  Musculoskeletal:     Comments: Left hand wrist with some rigidity stiffness due to pain with movement.  Skin:    General: Skin is warm and dry.     Findings: No erythema or rash.  Neurological:     Mental Status: She is alert and oriented to person, place, and time.  Psychiatric:        Mood and Affect: Mood normal.        Behavior: Behavior normal.        Thought Content: Thought content normal.     Comments: Well groomed, good eye contact, normal speech and thoughts     Results for orders placed or performed in visit on 11/10/22  Cologuard  Result Value Ref Range   COLOGUARD Negative Negative      Assessment & Plan:   Problem List Items Addressed This Visit     Primary osteoarthritis of both hands   Relevant Medications   predniSONE (DELTASONE) 20 MG tablet   Other Visit Diagnoses     Carpal tunnel syndrome on left    -  Primary   Relevant Medications   predniSONE (DELTASONE) 20 MG tablet   pregabalin (LYRICA) 100 MG capsule   Cervicogenic headache       Relevant Medications   predniSONE (DELTASONE) 20 MG tablet   pregabalin (LYRICA) 100 MG capsule   Paresthesia and pain of left extremity       Relevant Medications   predniSONE (DELTASONE) 20 MG tablet   pregabalin (LYRICA) 100 MG capsule         Left Carpal Tunnel Syndrome Left Hand Wrist Pain Osteoarthritis bilateral wrists  Worsening pain and paresthesia in the left hand extending up to the forearm. Previous cortisone injection provided temporary relief. Tramadol and Gabapentin, low dose Lyrica were ineffective for pain control.  -Start Prednisone 12-day taper for  immediate relief. -Increase Lyrica to 100mg  twice daily. -Consider stronger pain medications (Percocet) or muscle relaxants (Tizanidine) if current regimen is ineffective. -Recommend scheduling w/ Neurology for nerve conduction study to confirm diagnosis and guide further management.  Chronic Headaches -Recommend completing ordered MRI for further evaluation.      Letter written for her work that she will need time off for these tests.   No orders of the defined types were  placed in this encounter.   Meds ordered this encounter  Medications   predniSONE (DELTASONE) 20 MG tablet    Sig: Take 3 tablets daily (60mg ) for 4 days, take 2 tab daily (40mg ) for 4 days, take 1 tab daily (20mg ) for 4 days    Dispense:  24 tablet    Refill:  0   pregabalin (LYRICA) 100 MG capsule    Sig: Take 1 capsule (100 mg total) by mouth 2 (two) times daily.    Dispense:  60 capsule    Refill:  0    Follow up plan: Return if symptoms worsen or fail to improve.   Saralyn Pilar, DO [facility] 07/06/2023, 3:35 PM

## 2023-07-18 ENCOUNTER — Ambulatory Visit: Payer: Self-pay | Admitting: *Deleted

## 2023-07-18 DIAGNOSIS — G5602 Carpal tunnel syndrome, left upper limb: Secondary | ICD-10-CM

## 2023-07-18 DIAGNOSIS — G4486 Cervicogenic headache: Secondary | ICD-10-CM

## 2023-07-18 DIAGNOSIS — M19042 Primary osteoarthritis, left hand: Secondary | ICD-10-CM

## 2023-07-18 MED ORDER — HYDROCODONE-ACETAMINOPHEN 5-325 MG PO TABS: 1.0000 | ORAL_TABLET | ORAL | 0 refills | Status: DC | PRN

## 2023-07-18 NOTE — Telephone Encounter (Signed)
Reason for Disposition  [1] Caller has URGENT medicine question about med that PCP or specialist prescribed AND [2] triager unable to answer question    Lyrica not working  Answer Assessment - Initial Assessment Questions 1. NAME of MEDICINE: "What medicine(s) are you calling about?"     I started Lyrica.   He changed me to this because the Tramadol did not help.    The Lyrica is not working either.      I can't feel my left arm and hand.   Both hands have carpel tunnel but the left is feeling numb along with my hand since starting the Lyrica.     2. QUESTION: "What is your question?" (e.g., double dose of medicine, side effect)     This stuff is not working.   I need something else for the pain.   3. PRESCRIBER: "Who prescribed the medicine?" Reason: if prescribed by specialist, call should be referred to that group.     Dr. Althea Charon. 4. SYMPTOMS: "Do you have any symptoms?" If Yes, ask: "What symptoms are you having?"  "How bad are the symptoms (e.g., mild, moderate, severe)     Numbness in left arm and hand.   Still having pain.   The Lyrica is not working. 5. PREGNANCY:  "Is there any chance that you are pregnant?" "When was your last menstrual period?"     Not asked  Protocols used: Medication Question Call-A-AH

## 2023-07-18 NOTE — Telephone Encounter (Signed)
  Chief Complaint: Lyrica not helping with the carpel tunnel pain.    Symptoms: :eft arm and hand feeling numb also and pain no better since starting the Lyrica.    The Tramadol didn't work either so he is trying the Lyrica.    Frequency: Saw Dr. Althea Charon on 07/06/2023 for carpel tunnel pain. Pertinent Negatives: Patient denies pain being improved at all. Disposition: [] ED /[] Urgent Care (no appt availability in office) / [] Appointment(In office/virtual)/ []  [place] [facility]/ [] Home Care/ [] Refused Recommended Disposition /[] [place] Mobile Bus/ [x]  Follow-up with PCP Additional Notes: Message sent to Dr. Althea Charon.   She said it was ok to leave a detailed message on her phone because she is at work and probably won't be able to answer the phone.

## 2023-07-18 NOTE — Telephone Encounter (Signed)
I attempted to call patient back. She did not pick up as she said in the note she is at work but we can leave voicemail.  I notified her that I would be able to send stronger rx for pain, Hydrocodone-Acetaminophen 5/325mg  she can take as needed INSTEAD of Tramadol. Not together.  She may discontinue Lyrica if not successful.  She can pick up rx from pharmacy.  However, the NEXT actual main goal of the treatment plan is for her to follow up with neurology to pursue nerve testing and possible procedure / surgical option. This was discussed in detail at last visit  Other than stronger pain medicines, I do not have a way to solve her actual problem. The pain medicines are temporary.  Saralyn Pilar, DO [facility] 07/18/2023, 12:04 PM

## 2023-07-18 NOTE — Addendum Note (Signed)
Addended by: Smitty Cords on: 07/18/2023 12:04 PM   Modules accepted: Orders

## 2023-07-28 ENCOUNTER — Other Ambulatory Visit: Payer: Self-pay | Admitting: Family Medicine

## 2023-07-28 DIAGNOSIS — M19041 Primary osteoarthritis, right hand: Secondary | ICD-10-CM

## 2023-07-28 DIAGNOSIS — G4486 Cervicogenic headache: Secondary | ICD-10-CM

## 2023-07-28 DIAGNOSIS — G5602 Carpal tunnel syndrome, left upper limb: Secondary | ICD-10-CM

## 2023-07-28 NOTE — Telephone Encounter (Signed)
 Medication Refill -  Most Recent Primary Care Visit:  Provider: EDMAN MARSA PARAS  Department: [facility]  Visit Type: OFFICE VISIT  Date: 07/06/2023  Medication: HYDROcodone -acetaminophen  (NORCO/VICODIN) 5-325 MG tablet   Has the patient contacted their pharmacy? No  Is this the correct pharmacy for this prescription? Yes If no, delete pharmacy and type the correct one.  This is the patient's preferred pharmacy:  [id number] - [place], [place] - [address] [place] [address] Phone: [phone] Fax: [phone]   Has the prescription been filled recently? Yes  Is the patient out of the medication? No  Has the patient been seen for an appointment in the last year OR does the patient have an upcoming appointment? Yes  Can we respond through MyChart? No  Agent: Please be advised that Rx refills may take up to 3 business days. We ask that you follow-up with your pharmacy.

## 2023-07-29 ENCOUNTER — Telehealth: Payer: Self-pay | Admitting: Family Medicine

## 2023-07-29 MED ORDER — HYDROCODONE-ACETAMINOPHEN 5-325 MG PO TABS: 1.0000 | ORAL_TABLET | ORAL | 0 refills | Status: DC | PRN

## 2023-07-29 NOTE — Telephone Encounter (Signed)
 Copied from CRM [id number]. Topic: General - Other >> Jul 29, 2023  2:00 PM Everette C wrote: Reason for CRM: The patient has called to request an update on their previously requested refill of HYDROcodone -acetaminophen  (NORCO/VICODIN) 5-325 MG tablet [[id number]]  Please contact the patient further when possible

## 2023-07-29 NOTE — Telephone Encounter (Signed)
 Pt is calling in checking the status of her refill for HYDROcodone-acetaminophen (NORCO/VICODIN) 5-325 MG tablet [[id number]] , pt wants to know will it be sent in today.

## 2023-07-29 NOTE — Telephone Encounter (Signed)
 Already received multiple requests.  I have signed the order now. I called the patient and updated her.  She is using 3 to 4 per day, I advised her next time let us  know in advance before next refill and we can either inc dose or inc pill count  She is now scheduled for [facility] procedure early or mid February 2025.  [name], DO [facility] 07/29/2023, 5:46 PM

## 2023-08-01 ENCOUNTER — Ambulatory Visit: Payer: Self-pay

## 2023-08-01 ENCOUNTER — Telehealth: Payer: Self-pay | Admitting: Family Medicine

## 2023-08-01 NOTE — Telephone Encounter (Signed)
 Chief Complaint: Medication Question  Symptoms: Carpal Tunnel hand pain    Disposition: [] ED /[] Urgent Care (no appt availability in office) / [] Appointment(In office/virtual)/ []  [place] [facility]/ [x] Home Care/ [] Refused Recommended Disposition /[] [place] [facility]/ []  Follow-up with PCP Additional Notes: Patient stated when she spoke to PCP on Friday she was told that he sent a new Rx for her hand pain to the pharmacy. She stated she was not able to pick the medicine up due to some state limitation that she does not understand.    Pharmacy called and spoke to [place], Pharmacist about the refill(s) Hydrocodone -acetaminophen  5-325 MG requested. Advised it was sent on 07/29/23 #30/0 refill(s). We will get it ready for her, Constance stated it looks like it was originally billed wrong but I fitted it. Constance stated the pharmacy will notify her when it is ready for pick-up.    Summary: carpol tunnel pain   Pt spoke with NT previously about her carpal tunnel, she was prescribed astronger med than tramadol . Oxycodocone was sent in ,but she says when she went to pick it up , she was told by pharmacy, that it was against state limitation and she couldn't get the med.     Reason for Disposition  Caller has medicine question only, adult not sick, AND triager answers question  Answer Assessment - Initial Assessment Questions 1. NAME of MEDICINE: What medicine(s) are you calling about?     HYDROcodone -acetaminophen  (NORCO/VICODIN) 5-325 MG tablet  2. QUESTION: What is your question? (e.g., double dose of medicine, side effect)     I'm having a hard time getting my medication the pharmacy stated I can't pick it up until after 08/14/23 but then called back and asked me can I pay for it. What am I suppose to do to get the medicine.  3. PRESCRIBER: Who prescribed the medicine? Reason: if prescribed by specialist, call should be referred to that group.     Dr. Edman  4. SYMPTOMS: Do you  have any symptoms? If Yes, ask: What symptoms are you having?  How bad are the symptoms (e.g., mild, moderate, severe)     Carpal Tunnel hand pain  Protocols used: Medication Question Call-A-AH

## 2023-08-01 NOTE — Telephone Encounter (Signed)
 Pt is calling in requesting to speak with Dr. Kirtland Bouchard regarding her medication. Pt says Dr. Kirtland Bouchard called her on Friday and spoke with her, and she has some things she would like to speak with him about. Please follow up with pt.

## 2023-08-15 ENCOUNTER — Ambulatory Visit: Payer: Self-pay

## 2023-08-15 DIAGNOSIS — G4486 Cervicogenic headache: Secondary | ICD-10-CM

## 2023-08-15 DIAGNOSIS — G5602 Carpal tunnel syndrome, left upper limb: Secondary | ICD-10-CM

## 2023-08-15 DIAGNOSIS — M19041 Primary osteoarthritis, right hand: Secondary | ICD-10-CM

## 2023-08-15 MED ORDER — HYDROCODONE-ACETAMINOPHEN 5-325 MG PO TABS: 1.0000 | ORAL_TABLET | ORAL | 0 refills | Status: DC | PRN

## 2023-08-15 NOTE — Telephone Encounter (Signed)
LM for patient to return call.

## 2023-08-15 NOTE — Telephone Encounter (Signed)
  Chief Complaint: Pain in wrist Symptoms: pain Frequency: many months Pertinent Negatives: Patient denies  Disposition: [] ED /[] Urgent Care (no appt availability in office) / [] Appointment(In office/virtual)/ []  [place] [facility]/ [] Home Care/ [] Refused Recommended Disposition /[]  Mobile Bus/ [x]  Follow-up with PCP Additional Notes: Pt is frustrated. She has had this pain in her wrist for a long time. She is going to see Ortho 2/13, but is again out of pain medication. Pt states that every 2 weeks she has to call and request pain medication. She is given 30 pills at a time. She takes them as needed, which is 4 times a day. She needs her medication refills  be for a greater quantity. She has been out of pain medication since Friday. 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 Pt is requesting a larger quantity of pain medication be called in at a time. Pt states she will pay out of pocket for them. Please advise     Reason for Disposition  Hand or wrist pain is a chronic symptom (recurrent or ongoing AND present > 4 weeks)  Answer Assessment - Initial Assessment Questions 1. ONSET: "When did the pain start?"     Ongoing for a long time 2. LOCATION: "Where is the pain located?"     Wrist 3. PAIN: "How bad is the pain?" (Scale 1-10; or mild, moderate, severe)   - MILD (1-3): doesn't interfere with normal activities   - MODERATE (4-7): interferes with normal activities (e.g., work or school) or awakens from sleep   - SEVERE (8-10): excruciating pain, unable to use hand at all     10/10 4. WORK OR EXERCISE: "Has there been any recent work or exercise that involved this part (i.e., hand or wrist) of the body?"     yes 5. CAUSE: "What do you think is causing the pain?"     Carpal tunnel 6. AGGRAVATING FACTORS: "What makes the pain worse?" (e.g., using computer)     Using computer  Protocols used: Hand and Wrist  Pain-A-AH

## 2023-08-15 NOTE — Addendum Note (Signed)
Addended by: Smitty Cords on: 08/15/2023 10:45 AM   Modules accepted: Orders

## 2023-08-15 NOTE — Telephone Encounter (Signed)
Please let patient know that I will increase pill quantity to 56 pills per order, that is quantity for 4 times per day for 2 weeks or 14 days.  Rx sent to pharmacy as of now.  We are limited on quantity and usually cannot order 30 day supply of this type of narcotic. 2 week supply now at higher quantity should help her get to the Ortho appointment.  She can contact us again in 2 weeks for additional refills and updates  Saralyn Pilar, DO [facility] 08/15/2023, 10:45 AM

## 2023-08-19 ENCOUNTER — Emergency Department: Payer: Self-pay

## 2023-08-19 ENCOUNTER — Other Ambulatory Visit: Payer: Self-pay

## 2023-08-19 ENCOUNTER — Emergency Department
Admission: EM | Admit: 2023-08-19 | Discharge: 2023-08-19 | Disposition: A | Discharge status: Home / Self Care | Payer: Self-pay | Attending: Emergency Medicine | Admitting: Emergency Medicine

## 2023-08-19 DIAGNOSIS — R0602 Shortness of breath: Secondary | ICD-10-CM | POA: Insufficient documentation

## 2023-08-19 DIAGNOSIS — R002 Palpitations: Secondary | ICD-10-CM | POA: Insufficient documentation

## 2023-08-19 DIAGNOSIS — R0789 Other chest pain: Secondary | ICD-10-CM | POA: Insufficient documentation

## 2023-08-19 DIAGNOSIS — I10 Essential (primary) hypertension: Secondary | ICD-10-CM | POA: Insufficient documentation

## 2023-08-19 DIAGNOSIS — R059 Cough, unspecified: Secondary | ICD-10-CM | POA: Insufficient documentation

## 2023-08-19 DIAGNOSIS — R079 Chest pain, unspecified: Secondary | ICD-10-CM

## 2023-08-19 DIAGNOSIS — Z20822 Contact with and (suspected) exposure to covid-19: Secondary | ICD-10-CM | POA: Insufficient documentation

## 2023-08-19 LAB — RESP PANEL BY RT-PCR (RSV, FLU A&B, COVID)  RVPGX2
Influenza A by PCR: NEGATIVE
Influenza B by PCR: NEGATIVE
Resp Syncytial Virus by PCR: NEGATIVE
SARS Coronavirus 2 by RT PCR: NEGATIVE

## 2023-08-19 LAB — HEPATIC FUNCTION PANEL
ALT: 28 U/L (ref 0–44)
AST: 32 U/L (ref 15–41)
Albumin: 3.9 g/dL (ref 3.5–5.0)
Alkaline Phosphatase: 81 U/L (ref 38–126)
Bilirubin, Direct: 0.1 mg/dL (ref 0.0–0.2)
Indirect Bilirubin: 0.8 mg/dL (ref 0.3–0.9)
Total Bilirubin: 0.9 mg/dL (ref 0.0–1.2)
Total Protein: 6.7 g/dL (ref 6.5–8.1)

## 2023-08-19 LAB — CBC
HCT: 39.5 % (ref 36.0–46.0)
Hemoglobin: 13.6 g/dL (ref 12.0–15.0)
MCH: 29.7 pg (ref 26.0–34.0)
MCHC: 34.4 g/dL (ref 30.0–36.0)
MCV: 86.2 fL (ref 80.0–100.0)
Platelets: 187 10*3/uL (ref 150–400)
RBC: 4.58 MIL/uL (ref 3.87–5.11)
RDW: 12.2 % (ref 11.5–15.5)
WBC: 6.4 10*3/uL (ref 4.0–10.5)
nRBC: 0 % (ref 0.0–0.2)

## 2023-08-19 LAB — BASIC METABOLIC PANEL
Anion gap: 12 (ref 5–15)
BUN: 13 mg/dL (ref 6–20)
CO2: 28 mmol/L (ref 22–32)
Calcium: 8.7 mg/dL — ABNORMAL LOW (ref 8.9–10.3)
Chloride: 100 mmol/L (ref 98–111)
Creatinine, Ser: 0.58 mg/dL (ref 0.44–1.00)
GFR, Estimated: >60 mL/min (ref >60)
Glucose, Bld: 141 mg/dL — ABNORMAL HIGH (ref 70–99)
Potassium: 3.1 mmol/L — ABNORMAL LOW (ref 3.5–5.1)
Sodium: 140 mmol/L (ref 135–145)

## 2023-08-19 LAB — TROPONIN I (HIGH SENSITIVITY)
Troponin I (High Sensitivity): 10 ng/L (ref <18)
Troponin I (High Sensitivity): 9 ng/L (ref <18)

## 2023-08-19 LAB — LIPASE, BLOOD: Lipase: 35 U/L (ref 11–51)

## 2023-08-19 LAB — D-DIMER, QUANTITATIVE: D-Dimer, Quant: <0.27 ug{FEU}/mL (ref 0.00–0.50)

## 2023-08-19 LAB — T4, FREE: Free T4: 0.65 ng/dL (ref 0.61–1.12)

## 2023-08-19 LAB — TSH: TSH: 1.906 u[IU]/mL (ref 0.350–4.500)

## 2023-08-19 MED ORDER — BENZONATATE 100 MG PO CAPS: 100.0000 mg | ORAL_CAPSULE | Freq: Three times a day (TID) | ORAL | 0 refills | Status: DC | PRN

## 2023-08-19 MED ORDER — DIPHENHYDRAMINE HCL 50 MG/ML IJ SOLN
12.5000 mg | Freq: Once | INTRAMUSCULAR | Status: AC
  Administered 2023-08-19: 12.5 mg via INTRAVENOUS
  Filled 2023-08-19: qty 1

## 2023-08-19 MED ORDER — ACETAMINOPHEN 500 MG PO TABS
1000.0000 mg | ORAL_TABLET | Freq: Once | ORAL | Status: AC
  Administered 2023-08-19: 1000 mg via ORAL
  Filled 2023-08-19: qty 2

## 2023-08-19 MED ORDER — METOCLOPRAMIDE HCL 5 MG/ML IJ SOLN
10.0000 mg | Freq: Once | INTRAMUSCULAR | Status: AC
  Administered 2023-08-19: 10 mg via INTRAVENOUS
  Filled 2023-08-19: qty 2

## 2023-08-19 MED ORDER — POTASSIUM CHLORIDE CRYS ER 20 MEQ PO TBCR
40.0000 meq | EXTENDED_RELEASE_TABLET | Freq: Once | ORAL | Status: AC
  Administered 2023-08-19: 40 meq via ORAL
  Filled 2023-08-19: qty 2

## 2023-08-19 NOTE — ED Provider Notes (Addendum)
[facility] Provider Note    Event Date/Time   First MD Initiated Contact with Patient 08/19/23 [phone]     (approximate)   History   Shortness of Breath and Chest Pain   HPI  Gloria Perkins is a 58 y.o. female  with HTN and HLD who comes in with SOB, cough. Pt reports multiple symptoms her most worrisome is four days of intermittent sob and chest pain with palpitations.  Denies any palpitations or SOB now. Chest pain also mild. NO radiation, no migration. The last time it happened was around 3AM. Does report a headache that woke her up from sleep as 3AM as well.  Headache has now come down. She reports being worried because her husband died in his sleep, next to her one year ago.  She reports some arm tingling but that has been chronic issue that PCP order nerve conduction study outpt that still needs to be done.  She denies this being new or associated with chest pain. She denies having this currently. She reports some constipation and stopped her opioids her PCP put her on 3 days ago due to it.   I reviewed pt office visit on 07/06/2023 pt BP was 140/88     Physical Exam   Triage Vital Signs: ED Triage Vitals  Encounter Vitals Group     BP 08/19/23 0657 (!) 162/96     Systolic BP Percentile --      Diastolic BP Percentile --      Pulse Rate 08/19/23 0657 94     Resp 08/19/23 0657 19     Temp 08/19/23 0657 99.1 F (37.3 C)     Temp Source 08/19/23 0657 Oral     SpO2 08/19/23 0657 96 %     Weight 08/19/23 0658 152 lb (68.9 kg)     Height 08/19/23 0658 5\' 2"  (1.575 m)     Head Circumference --      Peak Flow --      Pain Score 08/19/23 0701 7     Pain Loc --      Pain Education --      Exclude from Growth Chart --     Most recent vital signs: Vitals:   08/19/23 0657  BP: (!) 162/96  Pulse: 94  Resp: 19  Temp: 99.1 F (37.3 C)  SpO2: 96%     General: Awake, no distress.  CV:  Good peripheral perfusion.  Resp:  Normal effort.  Abd:  No  distention. Soft and non tender  Other:  Equal radial pulse. Sensation intact through. NO swelling in legs no calf tenderness    ED Results / Procedures / Treatments   Labs (all labs ordered are listed, but only abnormal results are displayed) Labs Reviewed  RESP PANEL BY RT-PCR (RSV, FLU A&B, COVID)  [id number]  CBC  BASIC METABOLIC PANEL  TROPONIN I (HIGH SENSITIVITY)     EKG  My interpretation of EKG:  Normal sinus rate of 86, no st elevation, no twi, normal intervals.  RADIOLOGY I have reviewed the xray personally and interpretted and no PNA    PROCEDURES:  Critical Care performed: No  Procedures   MEDICATIONS ORDERED IN ED: Medications  metoCLOPramide (REGLAN) injection 10 mg (10 mg Intravenous Given 08/19/23 0804)  acetaminophen (TYLENOL) tablet 1,000 mg (1,000 mg Oral Given 08/19/23 0803)  diphenhydrAMINE (BENADRYL) injection 12.5 mg (12.5 mg Intravenous Given 08/19/23 0804)     IMPRESSION / MDM / ASSESSMENT AND PLAN /  ED COURSE  I reviewed the triage vital signs and the nursing notes.   Patient's presentation is most consistent with acute presentation with potential threat to life or bodily function.   Differential: ICH given onset of pain <6 hours for headache will get CT head to rule out SAH. ACS, PE, PNA, Thyroid, COVID, FLU.  Pt does report a lot of stressors as well. Denies urinary symptoms.  Doubt dissection- neuro intact- no migration/no radiation of pain to back.   Trop negative BMP K 3.1 will give oral repletion CBC normal  Ddimer negative  Consider admission but cardiac markers are negative x 2.  Patient reports feeling much better after migraine cocktail.  Patient was repleted her low potassium.  We discussed following up with cardiology as I cannot predict future heart issues and given her palpitation she may need to get a echo, Holter monitor.  However at this time given her reassuring workup and near resolution of symptoms she feels comfortable  with discharge home.  Will prescribe some Tessalon Perles given she did report a cough.  She has no wheezing on examination to suggest bronchitis.  At this time patient feels comfortable with discharge home and follow-up with her PCP, cardiologist       FINAL CLINICAL IMPRESSION(S) / ED DIAGNOSES   Final diagnoses:  Chest pain, unspecified type  Palpitations     Rx / DC Orders   ED Discharge Orders          Ordered    Ambulatory referral to Cardiology        08/19/23 0920    benzonatate (TESSALON PERLES) 100 MG capsule  3 times daily PRN        08/19/23 0920             Note:  This document was prepared using Dragon voice recognition software and may include unintentional dictation errors.   Concha Se, MD 08/19/23 0981    Concha Se, MD 08/19/23 [phone]

## 2023-08-19 NOTE — ED Notes (Signed)
See triage notes. Patient c/o shortness of breath, body aches and her left arm occasionally going numb.

## 2023-08-19 NOTE — Discharge Instructions (Addendum)
Workup was reassuring today without any evidence of heart attack, blood clots.  However we cannot predict future issues therefore I referred you to cardiology to discuss your chest pain, palpitations with them to decide further workup of this.  Return to the ER if you develop worsening symptoms changes symptoms or any other concerns.

## 2023-08-19 NOTE — ED Triage Notes (Signed)
Pt comes with c/o, sob and body aches. Pt states her arm also goes numb sometimes. Pt states her pcp has put her on meds but no help. Pt states this started few days ago. Pt states she feels like her heart is going fast.

## 2023-09-06 ENCOUNTER — Other Ambulatory Visit: Payer: Self-pay | Admitting: Family Medicine

## 2023-09-06 DIAGNOSIS — M79609 Pain in unspecified limb: Secondary | ICD-10-CM

## 2023-09-06 DIAGNOSIS — G5602 Carpal tunnel syndrome, left upper limb: Secondary | ICD-10-CM

## 2023-09-06 NOTE — Telephone Encounter (Signed)
Requested medication (s) are due for refill today: yes  Requested medication (s) are on the active medication list: yes  Last refill:  07/06/23 #60/0  Future visit scheduled: no  Notes to clinic:  Unable to refill per protocol, cannot delegate.    Requested Prescriptions  Pending Prescriptions Disp Refills   pregabalin (LYRICA) 100 MG capsule [Pharmacy Med Name: PREGABALIN 100 MG CAPSULE] 60 capsule 0    Sig: TAKE 1 CAPSULE BY MOUTH TWICE A DAY     Not Delegated - Neurology:  Anticonvulsants - Controlled - pregabalin Failed - 09/06/2023  4:30 PM      Failed - This refill cannot be delegated      Passed - Cr in normal range and within 360 days    Creatinine, Ser  Date Value Ref Range Status  08/19/2023 0.58 0.44 - 1.00 mg/dL Final         Passed - Completed PHQ-2 or PHQ-9 in the last 360 days      Passed - Valid encounter within last 12 months    Recent Outpatient Visits           2 months ago Carpal tunnel syndrome on left   [facility] [place], Netta Neat, DO   7 months ago Carpal tunnel syndrome on left   [facility] Smitty Cords, DO   8 months ago Centrilobular emphysema [facility])   [place] Smitty Cords, DO   10 months ago Essential hypertension   [place] [facility] [place], Netta Neat, [place]

## 2023-09-12 ENCOUNTER — Other Ambulatory Visit: Payer: Self-pay | Admitting: Family Medicine

## 2023-09-12 DIAGNOSIS — E7800 Pure hypercholesterolemia, unspecified: Secondary | ICD-10-CM

## 2023-09-12 DIAGNOSIS — F5101 Primary insomnia: Secondary | ICD-10-CM

## 2023-09-12 DIAGNOSIS — I10 Essential (primary) hypertension: Secondary | ICD-10-CM

## 2023-09-13 NOTE — Telephone Encounter (Signed)
Requested medications are due for refill today.  yes  Requested medications are on the active medications list.  yes  Last refill. 04/26/2023 #30 2rf  Future visit scheduled.   no  Notes to clinic.  Refill not delegated.    Requested Prescriptions  Pending Prescriptions Disp Refills   zolpidem (AMBIEN) 10 MG tablet [Pharmacy Med Name: ZOLPIDEM TARTRATE 10 MG TABLET] 30 tablet 2    Sig: TAKE 1 TABLET BY MOUTH AT BEDTIME AS NEEDED FOR SLEEP.     Not Delegated - Psychiatry:  Anxiolytics/Hypnotics Failed - 09/13/2023  2:48 PM      Failed - This refill cannot be delegated      Failed - Urine Drug Screen completed in last 360 days      Passed - Valid encounter within last 6 months    Recent Outpatient Visits           2 months ago Carpal tunnel syndrome on left   [place] [facility] [place], Netta Neat, DO   7 months ago Carpal tunnel syndrome on left   [place] [facility] [place], Netta Neat, DO   8 months ago Centrilobular emphysema [facility])   [place] [facility] Smitty Cords, DO   10 months ago Essential hypertension   [place] [place], Netta Neat, DO              Refused Prescriptions Disp Refills   atorvastatin (LIPITOR) 20 MG tablet [Pharmacy Med Name: ATORVASTATIN 20 MG TABLET] 90 tablet 3    Sig: TAKE 1 TABLET BY MOUTH EVERY DAY     Cardiovascular:  Antilipid - Statins Failed - 09/13/2023  2:48 PM      Failed - Lipid Panel in normal range within the last 12 months    No results found for: "CHOL", "POCCHOL", "CHOLTOT" No results found for: "LDLCALC", "LDLC", "HIRISKLDL", "POCLDL", "LDLDIRECT", "REALLDLC", "TOTLDLC" No results found for: "HDL", "POCHDL" No results found for: "TRIG", "POCTRIG"       Passed - Patient is not pregnant      Passed - Valid encounter within last 12 months    Recent Outpatient Visits           2 months ago Carpal tunnel  syndrome on left   [facility] [place], Netta Neat, DO   7 months ago Carpal tunnel syndrome on left   [facility] [place], Netta Neat, DO   8 months ago Centrilobular emphysema [facility])   [place] [facility] Smitty Cords, DO   10 months ago Essential hypertension   [place] [facility] [place], Netta Neat, [place]

## 2023-09-13 NOTE — Telephone Encounter (Signed)
Requested Prescriptions  Pending Prescriptions Disp Refills   zolpidem (AMBIEN) 10 MG tablet [Pharmacy Med Name: ZOLPIDEM TARTRATE 10 MG TABLET] 30 tablet 2    Sig: TAKE 1 TABLET BY MOUTH AT BEDTIME AS NEEDED FOR SLEEP.     Not Delegated - Psychiatry:  Anxiolytics/Hypnotics Failed - 09/13/2023  2:48 PM      Failed - This refill cannot be delegated      Failed - Urine Drug Screen completed in last 360 days      Passed - Valid encounter within last 6 months    Recent Outpatient Visits           2 months ago Carpal tunnel syndrome on left   [place] [facility] [place], Netta Neat, DO   7 months ago Carpal tunnel syndrome on left   [place] [facility] [place], Netta Neat, DO   8 months ago Centrilobular emphysema [facility])   [place] [facility] Smitty Cords, DO   10 months ago Essential hypertension   [place] [facility] [place], Netta Neat, DO              Refused Prescriptions Disp Refills   atorvastatin (LIPITOR) 20 MG tablet [Pharmacy Med Name: ATORVASTATIN 20 MG TABLET] 90 tablet 3    Sig: TAKE 1 TABLET BY MOUTH EVERY DAY     Cardiovascular:  Antilipid - Statins Failed - 09/13/2023  2:48 PM      Failed - Lipid Panel in normal range within the last 12 months    No results found for: "CHOL", "POCCHOL", "CHOLTOT" No results found for: "LDLCALC", "LDLC", "HIRISKLDL", "POCLDL", "LDLDIRECT", "REALLDLC", "TOTLDLC" No results found for: "HDL", "POCHDL" No results found for: "TRIG", "POCTRIG"       Passed - Patient is not pregnant      Passed - Valid encounter within last 12 months    Recent Outpatient Visits           2 months ago Carpal tunnel syndrome on left   [facility] [place], Netta Neat, DO   7 months ago Carpal tunnel syndrome on left   [facility] [place], Netta Neat, DO   8 months ago  Centrilobular emphysema [facility])   [place] [facility] Smitty Cords, DO   10 months ago Essential hypertension   [place] [facility] [place], Netta Neat, [place]

## 2023-09-20 ENCOUNTER — Other Ambulatory Visit: Payer: Self-pay | Admitting: Family Medicine

## 2023-09-20 DIAGNOSIS — M19042 Primary osteoarthritis, left hand: Secondary | ICD-10-CM

## 2023-09-20 DIAGNOSIS — G5602 Carpal tunnel syndrome, left upper limb: Secondary | ICD-10-CM

## 2023-09-20 DIAGNOSIS — G4486 Cervicogenic headache: Secondary | ICD-10-CM

## 2023-09-20 NOTE — Telephone Encounter (Signed)
 Copied from CRM [id number]. Topic: Clinical - Medication Refill >> Sep 20, 2023 12:53 PM Patsy Lager T wrote: Most Recent Primary Care Visit:  Provider: Smitty Cords  [facility]  Visit Type: OFFICE VISIT  Date: 07/06/2023  Medication: HYDROcodone-acetaminophen (NORCO/VICODIN) 5-325 MG tablet and amLODipine (NORVASC) 10 MG tablet   Has the patient contacted their pharmacy? No  Is this the correct pharmacy for this prescription? Yes If no, delete pharmacy and type the correct one.  This is the patient's preferred pharmacy:  [id number] [place] [place] - [address] [place] [address] Phone: [phone] Fax: [phone]  Has the prescription been filled recently? Yes  Is the patient out of the medication? Yes  Has the patient been seen for an appointment in the last year OR does the patient have an upcoming appointment? Yes  Can we respond through MyChart? No  Agent: Please be advised that Rx refills may take up to 3 business days. We ask that you follow-up with your pharmacy.  Patient said she doesn't have the money to pay the facility she was referred to so she needs meds until she can save up to pay her balance

## 2023-09-21 MED ORDER — HYDROCODONE-ACETAMINOPHEN 5-325 MG PO TABS: 1.0000 | ORAL_TABLET | ORAL | 0 refills | Status: DC | PRN

## 2023-09-21 NOTE — Telephone Encounter (Signed)
 Requested medication (s) are due for refill today: Yes  Requested medication (s) are on the active medication list: Yes  Last refill:  08/15/23  Future visit scheduled: No  Notes to clinic:  Unable to refill per protocol, cannot delegate.      Requested Prescriptions  Pending Prescriptions Disp Refills   HYDROcodone-acetaminophen (NORCO/VICODIN) 5-325 MG tablet 56 tablet 0    Sig: Take 1 tablet by mouth every 4 (four) hours as needed for moderate pain (pain score 4-6).     Not Delegated - Analgesics:  Opioid Agonist Combinations Failed - 09/21/2023 11:54 AM      Failed - This refill cannot be delegated      Failed - Urine Drug Screen completed in last 360 days      Passed - Valid encounter within last 3 months    Recent Outpatient Visits           2 months ago Carpal tunnel syndrome on left   [place] [facility] [place], Netta Neat, DO   8 months ago Carpal tunnel syndrome on left   [facility] Smitty Cords, DO   9 months ago Centrilobular emphysema [facility])   [place] [facility] Smit[facility] Cords, DO   10 months ago Essential hypertension    [facility] [place], Netta Neat, [place]

## 2023-10-05 ENCOUNTER — Other Ambulatory Visit: Payer: Self-pay | Admitting: Family Medicine

## 2023-10-05 DIAGNOSIS — F5101 Primary insomnia: Secondary | ICD-10-CM

## 2023-10-05 MED ORDER — ZOLPIDEM TARTRATE 10 MG PO TABS: 10.0000 mg | ORAL_TABLET | Freq: Every evening | ORAL | 2 refills | Status: DC | PRN

## 2023-10-05 NOTE — Telephone Encounter (Signed)
 Copied from CRM [id number]. Topic: Clinical - Medication Refill >> Oct 05, 2023 11:00 AM Truddie Crumble wrote: Most Recent Primary Care Visit:  Provider: Smitty Cords  [facility]  Visit Type: OFFICE VISIT  Date: 07/06/2023  Medication: zolpidem (AMBIEN) 10 MG tablet  Has the patient contacted their pharmacy? Yes (Agent: If no, request that the patient contact the pharmacy for the refill. If patient does not wish to contact the pharmacy document the reason why and proceed with request.) (Agent: If yes, when and what did the pharmacy advise?)  Is this the correct pharmacy for this prescription? Yes If no, delete pharmacy and type the correct one.  This is the patient's preferred pharmacy:  [id number] [place] [place] - [address] [place] [address] Phone: [phone] Fax: [phone]   Has the prescription been filled recently? No  Is the patient out of the medication? Yes  Has the patient been seen for an appointment in the last year OR does the patient have an upcoming appointment? Yes  Can we respond through MyChart? No  Agent: Please be advised that Rx refills may take up to 3 business days. We ask that you follow-up with your pharmacy.

## 2023-10-06 ENCOUNTER — Telehealth: Payer: Self-pay

## 2023-10-06 NOTE — Telephone Encounter (Signed)
 Looks like patient has been taking more than the prescribed 1 tablet daily. Is this something we can advise the pharmacy to fill early? Not sure how that works, especially with insurance and all. Please advise. Thanks!

## 2023-10-06 NOTE — Telephone Encounter (Signed)
 Please call patient back to notify her to pick up med today  [facility] spoke [facility]. I gave them verbal authorization to override this time. She can pick up the next 30 day pill count today.  It is extremely important that she only take max of 1 pill per dose. They will not be able to authorize early fill again in the future. The issue is that it is a controlled substance and there are additional restrictions.  FYI if normal uncontrolled medicine is needing an early fill, we can try to request it otherwise if denied by insurance patients can pay out of pocket if we call and request it be filled early.  Gloria Pilar, DO [facility] 10/06/2023, 1:03 PM

## 2023-10-06 NOTE — Telephone Encounter (Signed)
 Copied from CRM [id number]. Topic: Clinical - Prescription Issue >> Oct 06, 2023  9:51 AM Gaetano Hawthorne wrote: Reason for CRM: Patient received a message from pharmacy that they are unable to fill her Ambien until 03/18 - Patient has been out of medication for two days now.  Contact Center Agent looked at fill history and saw that this was last ordered on 02/18 - Patient did mention that sometimes she takes 1 and half pills. Is there anything that the clinic can do for her to get the medication prior to 03/18?   [facility] pharmacy [facility]  [id number] [place] [place] - [address] Phone: [phone] Fax: [phone]

## 2023-10-06 NOTE — Telephone Encounter (Signed)
 Left message for patient to return call.

## 2023-10-13 ENCOUNTER — Other Ambulatory Visit: Payer: Self-pay | Admitting: Family Medicine

## 2023-10-13 DIAGNOSIS — I10 Essential (primary) hypertension: Secondary | ICD-10-CM

## 2023-10-13 DIAGNOSIS — E7800 Pure hypercholesterolemia, unspecified: Secondary | ICD-10-CM

## 2023-10-14 NOTE — Telephone Encounter (Signed)
 Requested medication (s) are due for refill today: yes for next fill  Requested medication (s) are on the active medication list: yes  Last refill:  11/10/22 #90/3  Future visit scheduled: no  Notes to clinic:  Unable to refill per protocol due to failed labs, no updated results.      Requested Prescriptions  Pending Prescriptions Disp Refills   atorvastatin (LIPITOR) 20 MG tablet [Pharmacy Med Name: ATORVASTATIN 20 MG TABLET] 90 tablet 3    Sig: TAKE 1 TABLET BY MOUTH EVERY DAY     Cardiovascular:  Antilipid - Statins Failed - 10/14/2023  1:11 PM      Failed - Lipid Panel in normal range within the last 12 months    No results found for: "CHOL", "POCCHOL", "CHOLTOT" No results found for: "LDLCALC", "LDLC", "HIRISKLDL", "POCLDL", "LDLDIRECT", "REALLDLC", "TOTLDLC" No results found for: "HDL", "POCHDL" No results found for: "TRIG", "POCTRIG"       Passed - Patient is not pregnant      Passed - Valid encounter within last 12 months    Recent Outpatient Visits           3 months ago Carpal tunnel syndrome on left   [place] [facility] [place], Netta Neat, DO   8 months ago Carpal tunnel syndrome on left   [facility] Smitty Cords, DO   9 months ago Centrilobular emphysema [facility])   [place] [facility] Smit[facility] Cords, DO   11 months ago Essential hypertension   [place] [facility] [place], Netta Neat, [place]

## 2023-10-17 ENCOUNTER — Other Ambulatory Visit: Payer: Self-pay | Admitting: Family Medicine

## 2023-10-17 ENCOUNTER — Telehealth: Payer: Self-pay

## 2023-10-17 DIAGNOSIS — G5602 Carpal tunnel syndrome, left upper limb: Secondary | ICD-10-CM

## 2023-10-17 DIAGNOSIS — G4486 Cervicogenic headache: Secondary | ICD-10-CM

## 2023-10-17 DIAGNOSIS — M19041 Primary osteoarthritis, right hand: Secondary | ICD-10-CM

## 2023-10-17 NOTE — Telephone Encounter (Signed)
 Copied from CRM [id number]. Topic: Clinical - Medication Refill >> Oct 17, 2023 10:48 AM Shelah Lewandowsky wrote: Most Recent Primary Care Visit:  Provider: Smitty Cords  [facility]  Visit Type: OFFICE VISIT  Date: 07/06/2023  Medication: HYDROcodone-acetaminophen (NORCO/VICODIN) 5-325 MG tablet  Has the patient contacted their pharmacy? No (Agent: If no, request that the patient contact the pharmacy for the refill. If patient does not wish to contact the pharmacy document the reason why and proceed with request.) (Agent: If yes, when and what did the pharmacy advise?)  Is this the correct pharmacy for this prescription? Yes If no, delete pharmacy and type the correct one.  This is the patient's preferred pharmacy:  [id number] [place] [place] - [address] [place] [address] Phone: [phone] Fax: [phone]   Has the prescription been filled recently? Yes  Is the patient out of the medication? Yes  Has the patient been seen for an appointment in the last year OR does the patient have an upcoming appointment? Yes  Can we respond through MyChart? No  Agent: Please be advised that Rx refills may take up to 3 business days. We ask that you follow-up with your pharmacy.

## 2023-10-17 NOTE — Telephone Encounter (Signed)
 Copied from CRM [id number]. Topic: General - Other >> Oct 17, 2023 10:47 AM Shelah Lewandowsky wrote: Reason for CRM: returning call from office, no message was left, no answer when calling CAL- please call [phone]

## 2023-10-17 NOTE — Telephone Encounter (Signed)
 Left message for patient to return call OK to advise

## 2023-10-18 MED ORDER — HYDROCODONE-ACETAMINOPHEN 5-325 MG PO TABS: 1.0000 | ORAL_TABLET | ORAL | 0 refills | Status: DC | PRN

## 2023-10-18 NOTE — Telephone Encounter (Signed)
 Requested medications are due for refill today.  yes  Requested medications are on the active medications list.  yes  Last refill. 09/21/2023 #56 0 rf  Future visit scheduled.   no  Notes to clinic.  Refill not delegated.    Requested Prescriptions  Pending Prescriptions Disp Refills   HYDROcodone-acetaminophen (NORCO/VICODIN) 5-325 MG tablet 56 tablet 0    Sig: Take 1 tablet by mouth every 4 (four) hours as needed for moderate pain (pain score 4-6).     Not Delegated - Analgesics:  Opioid Agonist Combinations Failed - 10/18/2023  3:55 PM      Failed - This refill cannot be delegated      Failed - Urine Drug Screen completed in last 360 days      Failed - Valid encounter within last 3 months    Recent Outpatient Visits           3 months ago Carpal tunnel syndrome on left   [place]mit[facility]ords[facility] DO   9 months ago Carpal tunnel syndrome on left   [facility] Smitty Cords, DO   10 months ago Centrilobular emphysema [facility])   [place] [facility] Smitty Cords, [place]   11 months ago Essential hypertension   [place] [place], Netta Neat, [place]

## 2023-11-14 ENCOUNTER — Other Ambulatory Visit: Payer: Self-pay | Admitting: Family Medicine

## 2023-11-14 DIAGNOSIS — F5101 Primary insomnia: Secondary | ICD-10-CM

## 2023-11-14 DIAGNOSIS — I10 Essential (primary) hypertension: Secondary | ICD-10-CM

## 2023-11-14 DIAGNOSIS — G5602 Carpal tunnel syndrome, left upper limb: Secondary | ICD-10-CM

## 2023-11-14 DIAGNOSIS — M19041 Primary osteoarthritis, right hand: Secondary | ICD-10-CM

## 2023-11-14 DIAGNOSIS — G4486 Cervicogenic headache: Secondary | ICD-10-CM

## 2023-11-14 NOTE — Telephone Encounter (Signed)
 Copied from CRM [id number]. Topic: Clinical - Medication Refill >> Nov 14, 2023  9:22 AM Zipporah Him wrote: Most Recent Primary Care Visit:  Provider: Raina Bunting  [facility]  Visit Type: OFFICE VISIT  Date: 07/06/2023  Medication: amLODipine  (NORVASC ) 10 MG, zolpidem  (AMBIEN ) 10 MG tablet, HYDROcodone -acetaminophen  (NORCO/VICODIN) 5-325 MG tablet  Has the patient contacted their pharmacy? Yes No refills  Is this the correct pharmacy for this prescription? Yes If no, delete pharmacy and type the correct one.  This is the patient's preferred pharmacy:  [id number] [place] [place] - [address] [place] [address] Phone: [phone] Fax: [phone]   Has the prescription been filled recently? No  Is the patient out of the medication? Yes  Has the patient been seen for an appointment in the last year OR does the patient have an upcoming appointment? Yes  Can we respond through MyChart? No  Agent: Please be advised that Rx refills may take up to 3 business days. We ask that you follow-up with your pharmacy.

## 2023-11-15 MED ORDER — ZOLPIDEM TARTRATE 10 MG PO TABS: 10.0000 mg | ORAL_TABLET | Freq: Every evening | ORAL | 2 refills | Status: DC | PRN

## 2023-11-15 MED ORDER — AMLODIPINE BESYLATE 10 MG PO TABS: 10.0000 mg | ORAL_TABLET | Freq: Every day | ORAL | 0 refills | Status: DC

## 2023-11-15 MED ORDER — HYDROCODONE-ACETAMINOPHEN 5-325 MG PO TABS: 1.0000 | ORAL_TABLET | ORAL | 0 refills | Status: DC | PRN

## 2023-11-15 NOTE — Telephone Encounter (Signed)
 Requested Prescriptions  Pending Prescriptions Disp Refills   amLODipine  (NORVASC ) 10 MG tablet 90 tablet 0    Sig: Take 1 tablet (10 mg total) by mouth daily.     Cardiovascular: Calcium  Channel Blockers 2 Failed - 11/15/2023  8:48 AM      Failed - Last BP in normal range    BP Readings from Last 1 Encounters:  08/19/23 (!) 162/96         Failed - Valid encounter within last 6 months    Recent Outpatient Visits   None            Passed - Last Heart Rate in normal range    Pulse Readings from Last 1 Encounters:  08/19/23 94          HYDROcodone -acetaminophen  (NORCO/VICODIN) 5-325 MG tablet 56 tablet 0    Sig: Take 1 tablet by mouth every 4 (four) hours as needed for moderate pain (pain score 4-6).     Not Delegated - Analgesics:  Opioid Agonist Combinations Failed - 11/15/2023  8:48 AM      Failed - This refill cannot be delegated      Failed - Urine Drug Screen completed in last 360 days      Failed - Valid encounter within last 3 months    Recent Outpatient Visits   None             zolpidem  (AMBIEN ) 10 MG tablet 30 tablet 2    Sig: Take 1 tablet (10 mg total) by mouth at bedtime as needed. for sleep     Not Delegated - Psychiatry:  Anxiolytics/Hypnotics Failed - 11/15/2023  8:48 AM      Failed - This refill cannot be delegated      Failed - Urine Drug Screen completed in last 360 days      Failed - Valid encounter within last 6 months    Recent Outpatient Visits   None

## 2023-11-15 NOTE — Telephone Encounter (Signed)
 Requested medication (s) are due for refill today: yes  Requested medication (s) are on the active medication list: yes  Last refill:  10/18/23  Future visit scheduled: no  Notes to clinic:  Unable to refill per protocol, cannot delegate.      Requested Prescriptions  Pending Prescriptions Disp Refills   HYDROcodone -acetaminophen  (NORCO/VICODIN) 5-325 MG tablet 56 tablet 0    Sig: Take 1 tablet by mouth every 4 (four) hours as needed for moderate pain (pain score 4-6).     Not Delegated - Analgesics:  Opioid Agonist Combinations Failed - 11/15/2023  8:48 AM      Failed - This refill cannot be delegated      Failed - Urine Drug Screen completed in last 360 days      Failed - Valid encounter within last 3 months    Recent Outpatient Visits   None             zolpidem  (AMBIEN ) 10 MG tablet 30 tablet 2    Sig: Take 1 tablet (10 mg total) by mouth at bedtime as needed. for sleep     Not Delegated - Psychiatry:  Anxiolytics/Hypnotics Failed - 11/15/2023  8:48 AM      Failed - This refill cannot be delegated      Failed - Urine Drug Screen completed in last 360 days      Failed - Valid encounter within last 6 months    Recent Outpatient Visits   None            Signed Prescriptions Disp Refills   amLODipine  (NORVASC ) 10 MG tablet 90 tablet 0    Sig: Take 1 tablet (10 mg total) by mouth daily.     Cardiovascular: Calcium  Channel Blockers 2 Failed - 11/15/2023  8:48 AM      Failed - Last BP in normal range    BP Readings from Last 1 Encounters:  08/19/23 (!) 162/96         Failed - Valid encounter within last 6 months    Recent Outpatient Visits   None            Passed - Last Heart Rate in normal range    Pulse Readings from Last 1 Encounters:  08/19/23 94

## 2023-11-15 NOTE — Telephone Encounter (Signed)
 Requested medication (s) are due for refill today: yes  Requested medication (s) are on the active medication list: yes  Last refill:  10/18/23  Future visit scheduled: no  Notes to clinic:  Unable to refill per protocol, cannot delegate.      Requested Prescriptions  Pending Prescriptions Disp Refills   HYDROcodone -acetaminophen  (NORCO/VICODIN) 5-325 MG tablet 56 tablet 0    Sig: Take 1 tablet by mouth every 4 (four) hours as needed for moderate pain (pain score 4-6).     Not Delegated - Analgesics:  Opioid Agonist Combinations Failed - 11/15/2023  8:50 AM      Failed - This refill cannot be delegated      Failed - Urine Drug Screen completed in last 360 days      Failed - Valid encounter within last 3 months    Recent Outpatient Visits   None             zolpidem  (AMBIEN ) 10 MG tablet 30 tablet 2    Sig: Take 1 tablet (10 mg total) by mouth at bedtime as needed. for sleep     Not Delegated - Psychiatry:  Anxiolytics/Hypnotics Failed - 11/15/2023  8:50 AM      Failed - This refill cannot be delegated      Failed - Urine Drug Screen completed in last 360 days      Failed - Valid encounter within last 6 months    Recent Outpatient Visits   None            Signed Prescriptions Disp Refills   amLODipine  (NORVASC ) 10 MG tablet 90 tablet 0    Sig: Take 1 tablet (10 mg total) by mouth daily.     Cardiovascular: Calcium  Channel Blockers 2 Failed - 11/15/2023  8:50 AM      Failed - Last BP in normal range    BP Readings from Last 1 Encounters:  08/19/23 (!) 162/96         Failed - Valid encounter within last 6 months    Recent Outpatient Visits   None            Passed - Last Heart Rate in normal range    Pulse Readings from Last 1 Encounters:  08/19/23 94

## 2023-12-12 ENCOUNTER — Telehealth: Payer: Self-pay | Admitting: Family Medicine

## 2023-12-12 DIAGNOSIS — G4486 Cervicogenic headache: Secondary | ICD-10-CM

## 2023-12-12 DIAGNOSIS — G5602 Carpal tunnel syndrome, left upper limb: Secondary | ICD-10-CM

## 2023-12-12 DIAGNOSIS — F5101 Primary insomnia: Secondary | ICD-10-CM

## 2023-12-12 DIAGNOSIS — M19041 Primary osteoarthritis, right hand: Secondary | ICD-10-CM

## 2023-12-12 NOTE — Telephone Encounter (Signed)
 Copied from CRM [id number]. Topic: Clinical - Medication Refill >> Dec 12, 2023 11:09 AM Lizabeth Riggs wrote: Medication: zolpidem  (AMBIEN ) 10 MG tablet AND HYDROcodone -acetaminophen  (NORCO/VICODIN) 5-325 MG tablet   Has the patient contacted their pharmacy? Yes (Agent: If no, request that the patient contact the pharmacy for the refill. If patient does not wish to contact the pharmacy document the reason why and proceed with request.) (Agent: If yes, when and what did the pharmacy advise?) Pharmacy needs order to refill   This is the patient's preferred pharmacy:  [id number] [place], [place] - [address] [place] [address] Phone: [phone] Fax: [phone]  Is this the correct pharmacy for this prescription? Yes If no, delete pharmacy and type the correct one.   Has the prescription been filled recently? No  Is the patient out of the medication? Yes - She wants to see if doctor will fill early  Has the patient been seen for an appointment in the last year OR does the patient have an upcoming appointment? Yes  Can we respond through MyChart? No  Agent: Please be advised that Rx refills may take up to 3 business days. We ask that you follow-up with your pharmacy.

## 2023-12-14 ENCOUNTER — Other Ambulatory Visit: Payer: Self-pay | Admitting: Family Medicine

## 2023-12-14 DIAGNOSIS — G5602 Carpal tunnel syndrome, left upper limb: Secondary | ICD-10-CM

## 2023-12-14 DIAGNOSIS — G4486 Cervicogenic headache: Secondary | ICD-10-CM

## 2023-12-14 DIAGNOSIS — F5101 Primary insomnia: Secondary | ICD-10-CM

## 2023-12-14 DIAGNOSIS — I10 Essential (primary) hypertension: Secondary | ICD-10-CM

## 2023-12-14 DIAGNOSIS — M19041 Primary osteoarthritis, right hand: Secondary | ICD-10-CM

## 2023-12-14 DIAGNOSIS — E7800 Pure hypercholesterolemia, unspecified: Secondary | ICD-10-CM

## 2023-12-14 MED ORDER — HYDROCODONE-ACETAMINOPHEN 5-325 MG PO TABS
1.0000 | ORAL_TABLET | ORAL | 0 refills | Status: DC | PRN
Start: 2023-12-14 — End: 2024-01-11

## 2023-12-14 NOTE — Telephone Encounter (Signed)
 Copied from CRM [id number]. Topic: Clinical - Medication Refill >> Dec 14, 2023  9:01 AM Georgeann Kindred wrote: Medication:  HYDROcodone -acetaminophen  (NORCO/VICODIN) 5-325 MG tablet  zolpidem  (AMBIEN ) 10 MG tablet   Has the patient contacted their pharmacy? No (Agent: If no, request that the patient contact the pharmacy for the refill. If patient does not wish to contact the pharmacy document the reason why and proceed with request.) (Agent: If yes, when and what did the pharmacy advise?)  This is the patient's preferred pharmacy:  [id number] [place] [place] - [address] [place] [address] Phone: [phone] Fax: [phone]  Is this the correct pharmacy for this prescription? Yes If no, delete pharmacy and type the correct one.   Has the prescription been filled recently? No  Is the patient out of the medication? Yes  Has the patient been seen for an appointment in the last year OR does the patient have an upcoming appointment? Yes  Can we respond through MyChart? No  Agent: Please be advised that Rx refills may take up to 3 business days. We ask that you follow-up with your pharmacy.

## 2023-12-14 NOTE — Telephone Encounter (Signed)
 Requested medication (s) are due for refill today: Hydrocodone  acetaminophen : 11/15/23 #56              Ambien  11/15/23 #30 2 RF  Requested medication (s) are on the active medication list: yes  Last refill:  yes  Future visit scheduled: no  Notes to clinic:  neither med delegated to Reorder by NT   Requested Prescriptions  Pending Prescriptions Disp Refills   HYDROcodone -acetaminophen  (NORCO/VICODIN) 5-325 MG tablet 56 tablet 0    Sig: Take 1 tablet by mouth every 4 (four) hours as needed for moderate pain (pain score 4-6).     Not Delegated - Analgesics:  Opioid Agonist Combinations Failed - 12/14/2023 11:16 AM      Failed - This refill cannot be delegated      Failed - Urine Drug Screen completed in last 360 days      Failed - Valid encounter within last 3 months    Recent Outpatient Visits   None             zolpidem  (AMBIEN ) 10 MG tablet 30 tablet 2    Sig: Take 1 tablet (10 mg total) by mouth at bedtime as needed. for sleep     Not Delegated - Psychiatry:  Anxiolytics/Hypnotics Failed - 12/14/2023 11:16 AM      Failed - This refill cannot be delegated      Failed - Urine Drug Screen completed in last 360 days      Failed - Valid encounter within last 6 months    Recent Outpatient Visits   None

## 2023-12-14 NOTE — Telephone Encounter (Signed)
 Refilled Pain medicine. I did not refill Ambien  since she has additional refills already on file.  She will need an appointment before I can prescribe any more pain medicine. Please notify her and schedule for next few weeks or when she will be out of new order.  Domingo Friend, DO [facility] 12/14/2023, 1:05 PM

## 2023-12-15 NOTE — Telephone Encounter (Signed)
 Requested medications are due for refill today.  no  Requested medications are on the active medications list.  yes  Last refill. Varied  Future visit scheduled.   no  Notes to clinic.  Not due for refill. Refill/refusal not delegated.    Requested Prescriptions  Pending Prescriptions Disp Refills   zolpidem  (AMBIEN ) 10 MG tablet 30 tablet 2    Sig: Take 1 tablet (10 mg total) by mouth at bedtime as needed. for sleep     Not Delegated - Psychiatry:  Anxiolytics/Hypnotics Failed - 12/15/2023  2:16 PM      Failed - This refill cannot be delegated      Failed - Urine Drug Screen completed in last 360 days      Failed - Valid encounter within last 6 months    Recent Outpatient Visits   None             HYDROcodone -acetaminophen  (NORCO/VICODIN) 5-325 MG tablet 56 tablet 0    Sig: Take 1 tablet by mouth every 4 (four) hours as needed for moderate pain (pain score 4-6).     Not Delegated - Analgesics:  Opioid Agonist Combinations Failed - 12/15/2023  2:16 PM      Failed - This refill cannot be delegated      Failed - Urine Drug Screen completed in last 360 days      Failed - Valid encounter within last 3 months    Recent Outpatient Visits   None

## 2024-01-11 ENCOUNTER — Other Ambulatory Visit: Payer: Self-pay | Admitting: Family Medicine

## 2024-01-11 DIAGNOSIS — G4486 Cervicogenic headache: Secondary | ICD-10-CM

## 2024-01-11 DIAGNOSIS — G5602 Carpal tunnel syndrome, left upper limb: Secondary | ICD-10-CM

## 2024-01-11 DIAGNOSIS — M19041 Primary osteoarthritis, right hand: Secondary | ICD-10-CM

## 2024-01-11 DIAGNOSIS — F5101 Primary insomnia: Secondary | ICD-10-CM

## 2024-01-11 NOTE — Telephone Encounter (Signed)
 Copied from CRM [id number]. Topic: Clinical - Medication Refill >> Jan 11, 2024  3:07 PM Carla L wrote: Medication: HYDROcodone -acetaminophen  (NORCO/VICODIN) 5-325 MG tablet zolpidem  (AMBIEN ) 10 MG tablet  Has the patient contacted their pharmacy? Yes Told to contact the office.   This is the patient's preferred pharmacy:  [id number] [place], [place] - [address] [place] [address] Phone: [phone] Fax: [phone]  Is this the correct pharmacy for this prescription? Yes   Has the prescription been filled recently? No  Is the patient out of the medication? Yes  Has the patient been seen for an appointment in the last year OR does the patient have an upcoming appointment? Yes  Can we respond through MyChart? No  Agent: Please be advised that Rx refills may take up to 3 business days. We ask that you follow-up with your pharmacy.

## 2024-01-12 ENCOUNTER — Telehealth: Payer: Self-pay

## 2024-01-12 MED ORDER — ZOLPIDEM TARTRATE 10 MG PO TABS: 10.0000 mg | ORAL_TABLET | Freq: Every evening | ORAL | 2 refills | Status: DC | PRN

## 2024-01-12 MED ORDER — HYDROCODONE-ACETAMINOPHEN 5-325 MG PO TABS: 1.0000 | ORAL_TABLET | ORAL | 0 refills | Status: DC | PRN

## 2024-01-12 NOTE — Telephone Encounter (Signed)
 Medication: HYDROcodone -acetaminophen  (NORCO/VICODIN) 5-325 MG tablet    zolpidem  (AMBIEN ) 10 MG tablet        Has the patient contacted their pharmacy? Yes    Told to contact the office.        This is the patient's preferred pharmacy:    [id number] [place], [place] - [address]    [address]    [place] [address]    Phone: [phone] Fax: [phone]        Is this the correct pharmacy for this prescription? Yes            Has the prescription been filled recently? No        Is the patient out of the medication? Yes        Has the patient been seen for an appointment in the last year OR does the patient have an upcoming appointment? Yes        Can we respond through MyChart? No        Agent: Please be advised that Rx refills may take up to 3 business days. We ask that you follow-up with your pharmacy.

## 2024-01-12 NOTE — Telephone Encounter (Signed)
 Both rx signed. She can check with pharmacy today for pick up  Domingo Friend, DO [facility] 01/12/2024, 2:11 PM

## 2024-01-12 NOTE — Telephone Encounter (Signed)
 Copied from CRM [id number]. Topic: Clinical - Medication Refill >> Jan 12, 2024 12:40 PM Emylou G wrote: Adv patient turn around time.. says her head is in pain.. needs this soon

## 2024-02-08 ENCOUNTER — Telehealth: Payer: Self-pay | Admitting: Family Medicine

## 2024-02-08 DIAGNOSIS — M19041 Primary osteoarthritis, right hand: Secondary | ICD-10-CM

## 2024-02-08 DIAGNOSIS — G5602 Carpal tunnel syndrome, left upper limb: Secondary | ICD-10-CM

## 2024-02-08 DIAGNOSIS — G4486 Cervicogenic headache: Secondary | ICD-10-CM

## 2024-02-08 DIAGNOSIS — F5101 Primary insomnia: Secondary | ICD-10-CM

## 2024-02-08 NOTE — Telephone Encounter (Unsigned)
 Copied from CRM [id number]. Topic: Clinical - Medication Refill >> Feb 08, 2024 12:30 PM Turkey B wrote: Medication: zolpidem  (AMBIEN ) 10 MG tablet/HYDROcodone -acetaminophen  (NORCO/VICODIN) 5-325 MG tablet  Has the patient contacted their pharmacy? No because of hydrochodone, so called in for both meds at the same time   This is the patient's preferred pharmacy:  [id number] [place], [place] - [address] [place] [address] Phone: [phone] Fax: [phone]  Is this the correct pharmacy for this prescription? yes   Has the prescription been filled recently? no  Is the patient out of the medication? yes  Has the patient been seen for an appointment in the last year OR does the patient have an upcoming appointment? yes  Can we respond through MyChart? yes  Agent: Please be advised that Rx refills may take up to 3 business days. We ask that you follow-up with your pharmacy.

## 2024-02-09 NOTE — Telephone Encounter (Signed)
 Requested medications are due for refill today.  Zolpidem  is not - norco is  Requested medications are on the active medications list.  yes  Last refill. 01/12/2024  Future visit scheduled.   yes  Notes to clinic.  Refill not delegated.    Requested Prescriptions  Pending Prescriptions Disp Refills   zolpidem  (AMBIEN ) 10 MG tablet 30 tablet 2    Sig: Take 1 tablet (10 mg total) by mouth at bedtime as needed. for sleep     Not Delegated - Psychiatry:  Anxiolytics/Hypnotics Failed - 02/09/2024  4:32 PM      Failed - This refill cannot be delegated      Failed - Urine Drug Screen completed in last 360 days      Failed - Valid encounter within last 6 months    Recent Outpatient Visits   None             HYDROcodone -acetaminophen  (NORCO/VICODIN) 5-325 MG tablet 56 tablet 0    Sig: Take 1 tablet by mouth every 4 (four) hours as needed for moderate pain (pain score 4-6).     Not Delegated - Analgesics:  Opioid Agonist Combinations Failed - 02/09/2024  4:32 PM      Failed - This refill cannot be delegated      Failed - Urine Drug Screen completed in last 360 days      Failed - Valid encounter within last 3 months    Recent Outpatient Visits   None

## 2024-02-10 ENCOUNTER — Telehealth: Payer: Self-pay

## 2024-02-10 MED ORDER — HYDROCODONE-ACETAMINOPHEN 5-325 MG PO TABS: 1.0000 | ORAL_TABLET | ORAL | 0 refills | Status: DC | PRN

## 2024-02-10 NOTE — Telephone Encounter (Signed)
 Copied from CRM [id number]. Topic: Clinical - Medication Question >> Feb 10, 2024  9:38 AM Gustabo D wrote: zolpidem  (AMBIEN ) 10 MG tablet- Patient medication was refused and wants to know why please contact her. She says  she needs this medication

## 2024-02-10 NOTE — Telephone Encounter (Signed)
 Patient advised prescriptions should be at her pharmacy.

## 2024-02-25 IMAGING — CT CT MAXILLOFACIAL W/O CM
3 series · 16 of 47 positions shown, 19 images · non-contrast
Comparison: 07/05/2021

CLINICAL DATA: Hit in face with door 1 month ago.  Nose bleed.



[Series 2: max soft · axial · 0.32mm/px · z∈[+264,+410]mm · 10 of 85 slices shown, 13 images]
[im 6/85  brain]
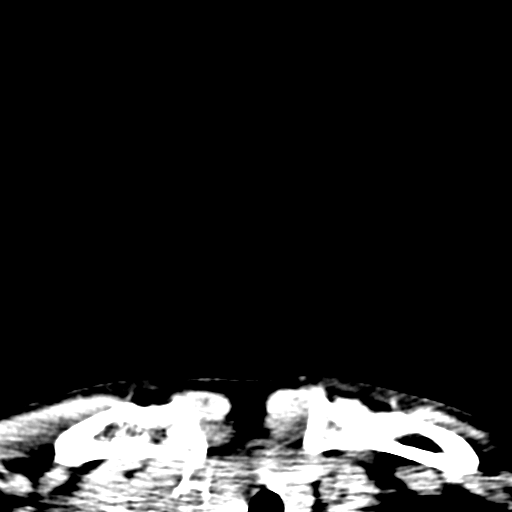
[im 6/85  bone]
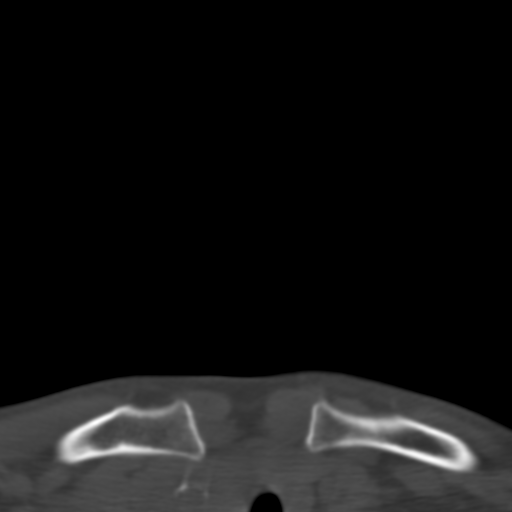
[im 15/85  bone]
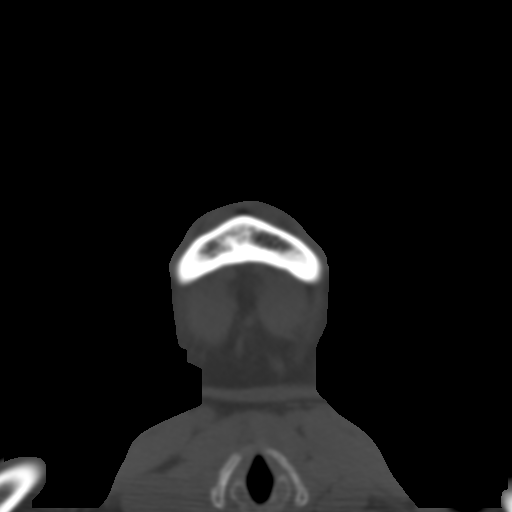
[im 24/85  bone]
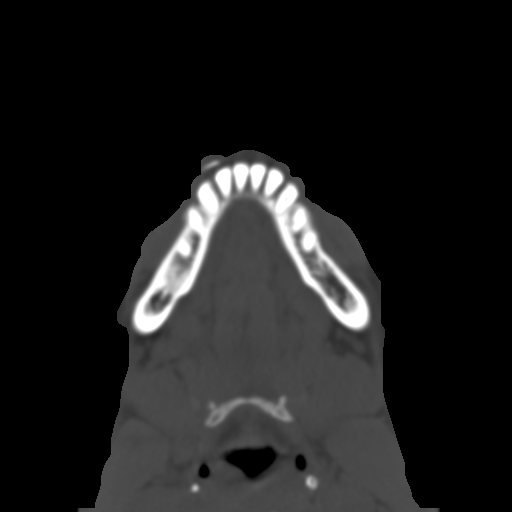
[im 29/85  bone]
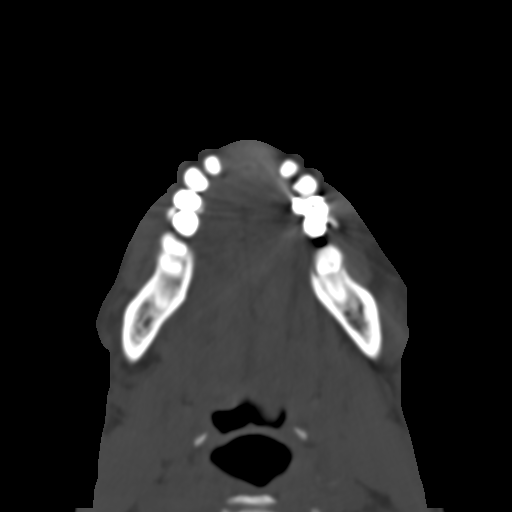
[im 38/85  brain]
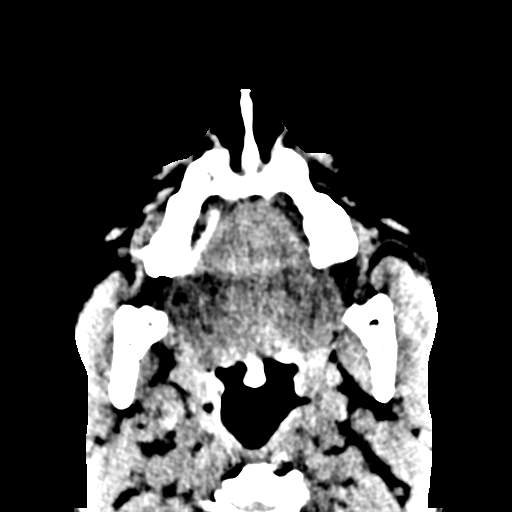
[im 38/85  bone]
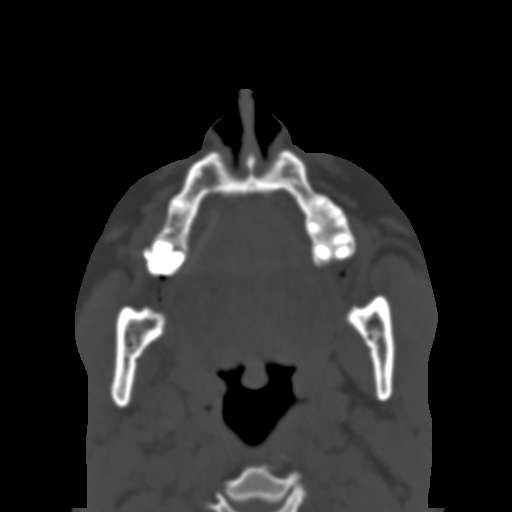
[im 47/85  bone]
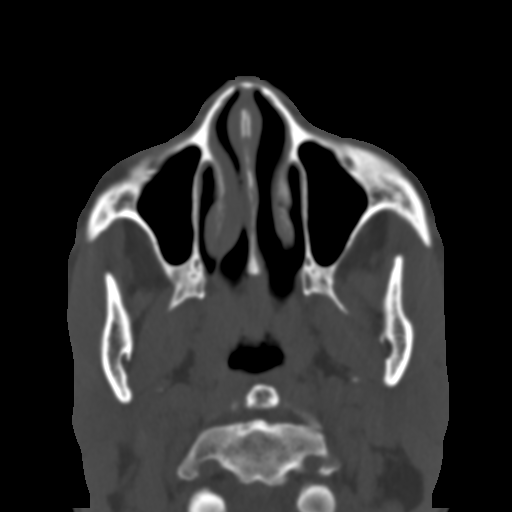
[im 56/85  bone]
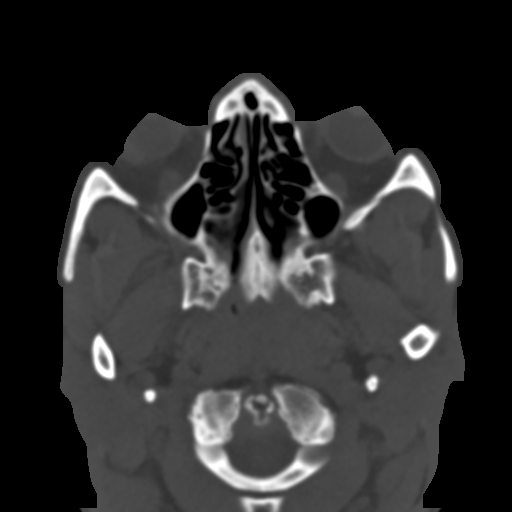
[im 64/85  bone]
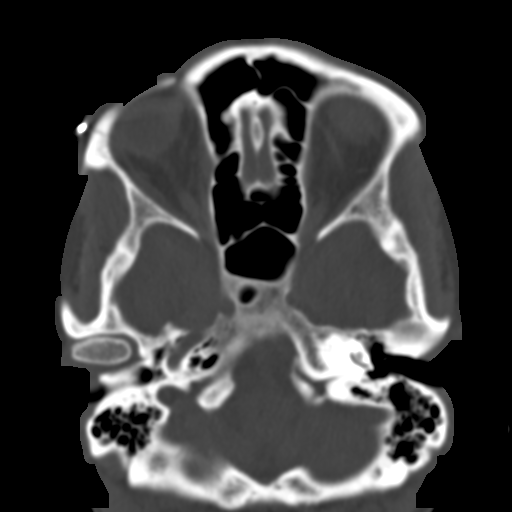
[im 70/85  brain]
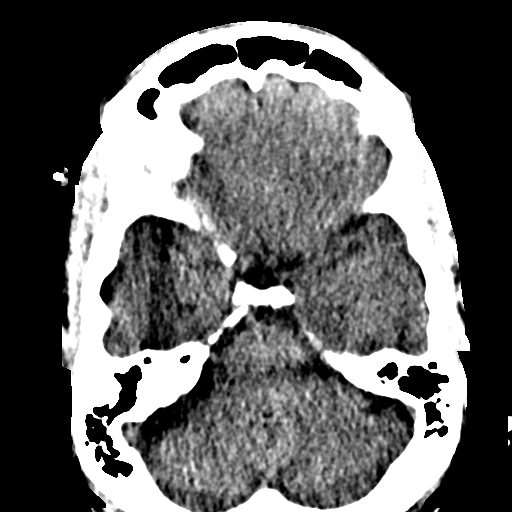
[im 70/85  bone]
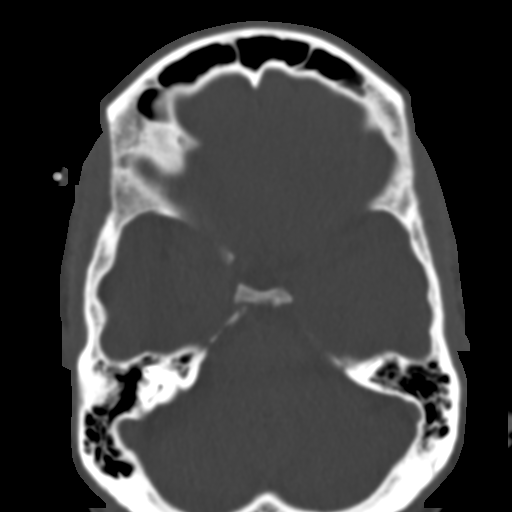
[im 79/85  bone]
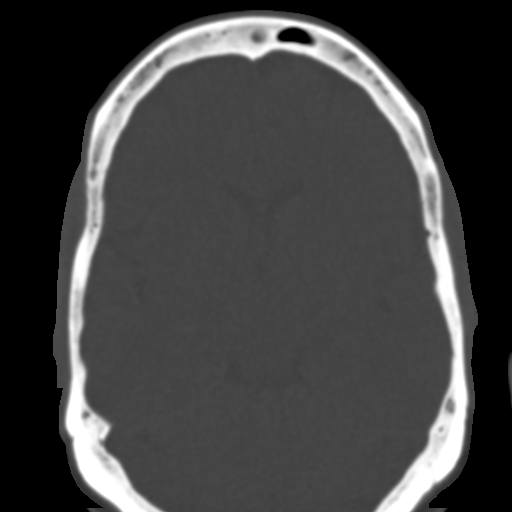

[Series 6: coronal soft · coronal · 0.34mm/px · 3 of 75 slices shown]
[im 25/75  bone]
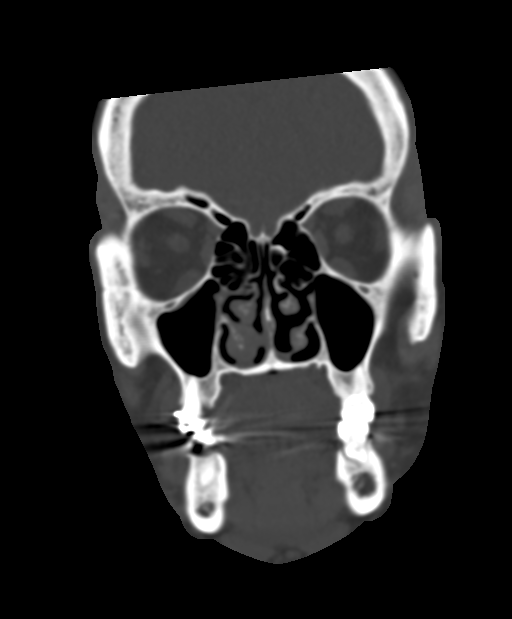
[im 33/75  bone]
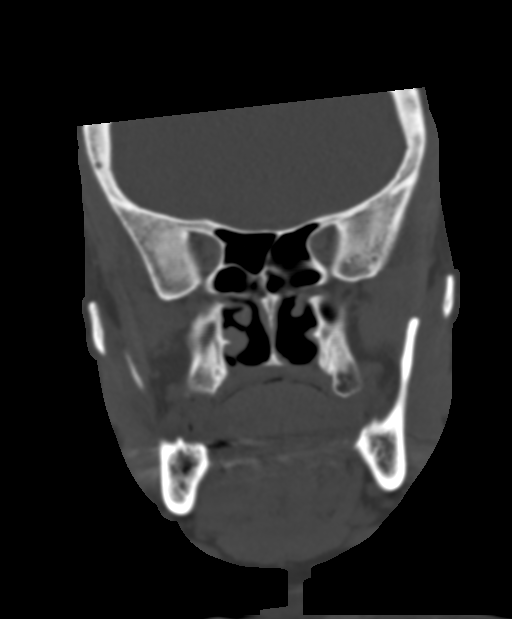
[im 42/75  bone]
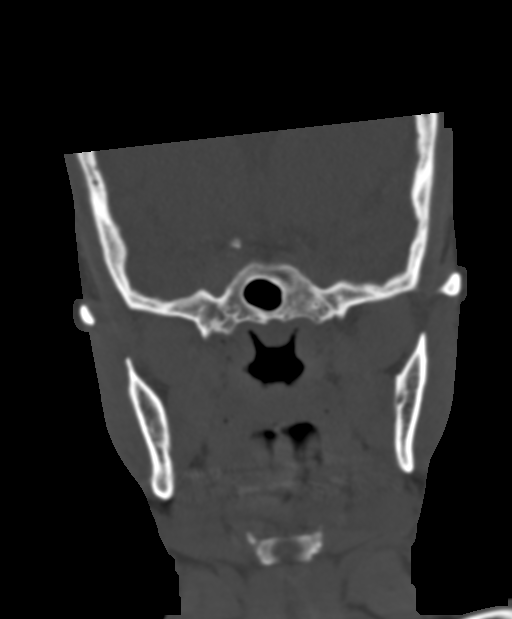

[Series 7: sagittal soft · sagittal · 0.38mm/px · 3 of 71 slices shown]
[im 24/71  bone]
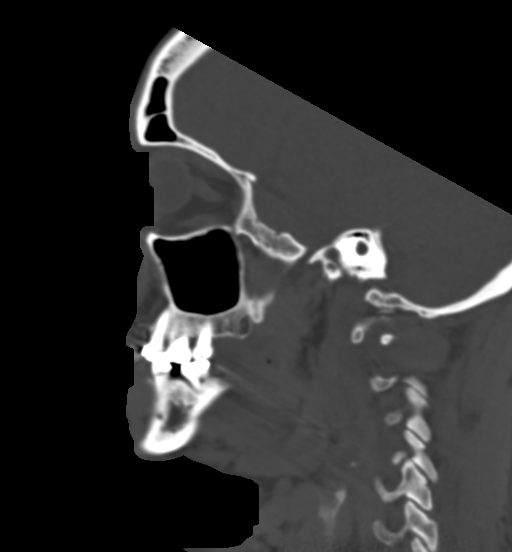
[im 36/71  bone]
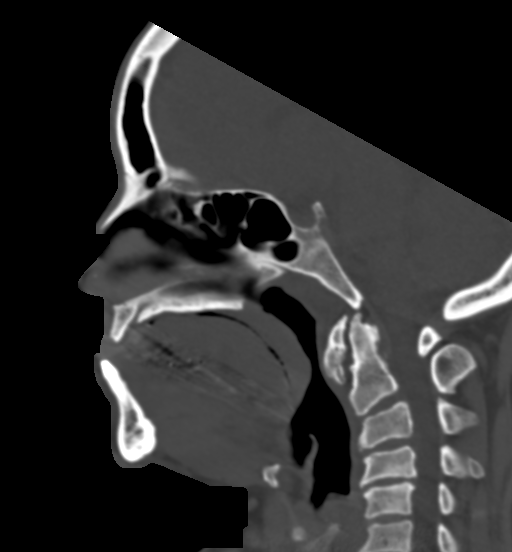
[im 47/71  bone]
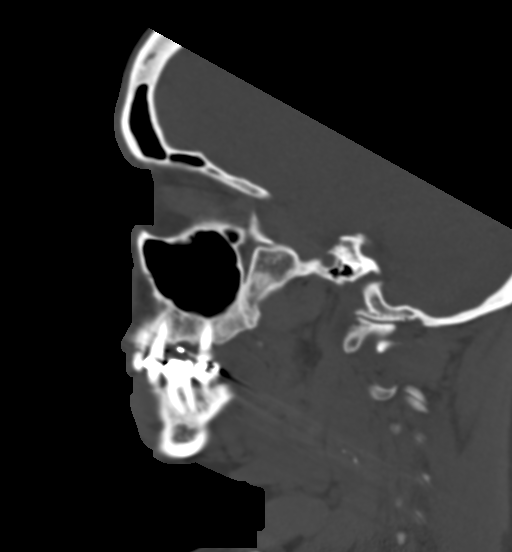

[16 of 47 positions shown; findings below may reference images not displayed]

FINDINGS: Osseous: No fracture or mandibular dislocation. No destructive
process.

Orbits: Negative. No traumatic or inflammatory finding.

Sinuses: Clear

Soft tissues: Negative

Limited intracranial: See head CT report
IMPRESSION: No evidence of facial or orbital fracture.

## 2024-03-12 ENCOUNTER — Telehealth: Payer: Self-pay

## 2024-03-12 ENCOUNTER — Other Ambulatory Visit: Payer: Self-pay | Admitting: Family Medicine

## 2024-03-12 DIAGNOSIS — G4486 Cervicogenic headache: Secondary | ICD-10-CM

## 2024-03-12 DIAGNOSIS — G5602 Carpal tunnel syndrome, left upper limb: Secondary | ICD-10-CM

## 2024-03-12 DIAGNOSIS — M19041 Primary osteoarthritis, right hand: Secondary | ICD-10-CM

## 2024-03-12 MED ORDER — HYDROCODONE-ACETAMINOPHEN 5-325 MG PO TABS: 1.0000 | ORAL_TABLET | ORAL | 0 refills | Status: DC | PRN

## 2024-03-12 NOTE — Telephone Encounter (Signed)
 I checked into her concern. All I see is last fill on Hydrocodone  was 02/10/24 for full 56 pills.  I don't see the 10 pill fill.  Last time Angeline ordered it, should have been for 56 pills.  I just sent a new order today since it has been 1 month. She should check with pharmacy next to see what they say next.  Please let her know usually when the pill count is reduced that is the pharmacy or insurance determining the pill count that they are allowed to fill, that is not something that we are changing here.   Disp Refills Start End   HYDROcodone -acetaminophen  (NORCO/VICODIN) 5-325 MG tablet 56 tablet 0 03/12/2024 --   Sig - Route: Take 1 tablet by mouth every 4 (four) hours as needed for moderate pain (pain score 4-6). - Oral   Sent to pharmacy as: HYDROcodone -acetaminophen  (NORCO/VICODIN) 5-325 MG tablet   Earliest Fill Date: 03/12/2024   E-Prescribing Status: Receipt confirmed by pharmacy (03/12/2024  3:33 PM EDT)     [name], DO [facility] 03/12/2024, 3:35 PM

## 2024-03-12 NOTE — Telephone Encounter (Signed)
 Spoke with patient, she was at work and is not supposed to be on her phone. Explained she could call back. Attempted to explain that we have sent in a new prescription however insurance or the pharmacy may change the pill count and we have no control. Patient abruptly hung up due to being at work. She may call back

## 2024-03-12 NOTE — Telephone Encounter (Signed)
 Copied from CRM #[id number]. Topic: Clinical - Medication Refill >> Mar 12, 2024  9:42 AM [name] wrote: Patient said she took her last dosage 2 days ago  Medication: HYDROcodone -acetaminophen  (NORCO/VICODIN) 5-325 MG tablet [  Has the patient contacted their pharmacy? Yes  This is the patient's preferred pharmacy:  [id number] [place], [place] - [address] [place] [address] Phone: [phone] Fax: [phone]  Is this the correct pharmacy for this prescription? Yes  Has the prescription been filled recently? Yes  Is the patient out of the medication? Yes  Has the patient been seen for an appointment in the last year OR does the patient have an upcoming appointment? Yes  Can we respond through MyChart? Yes  Agent: Please be advised that Rx refills may take up to 3 business days. We ask that you follow-up with your pharmacy.

## 2024-03-12 NOTE — Telephone Encounter (Signed)
 Copied from CRM #[id number]. Topic: Clinical - Medication Question >> Mar 12, 2024  2:05 PM Gustabo D wrote: Patient wants to know why her prescription is only for a 10 day supply for hydrocodone 

## 2024-04-12 ENCOUNTER — Other Ambulatory Visit: Payer: Self-pay | Admitting: Family Medicine

## 2024-04-12 ENCOUNTER — Ambulatory Visit: Admitting: Family Medicine

## 2024-04-12 ENCOUNTER — Encounter: Payer: Self-pay | Admitting: Family Medicine

## 2024-04-12 VITALS — BP 120/82 | HR 88 | Ht <= 58 in | Wt 156.0 lb

## 2024-04-12 DIAGNOSIS — M19041 Primary osteoarthritis, right hand: Secondary | ICD-10-CM

## 2024-04-12 DIAGNOSIS — I10 Essential (primary) hypertension: Secondary | ICD-10-CM

## 2024-04-12 DIAGNOSIS — J432 Centrilobular emphysema: Secondary | ICD-10-CM

## 2024-04-12 DIAGNOSIS — M79609 Pain in unspecified limb: Secondary | ICD-10-CM

## 2024-04-12 DIAGNOSIS — G4486 Cervicogenic headache: Secondary | ICD-10-CM

## 2024-04-12 DIAGNOSIS — G5602 Carpal tunnel syndrome, left upper limb: Secondary | ICD-10-CM | POA: Diagnosis not present

## 2024-04-12 DIAGNOSIS — F5101 Primary insomnia: Secondary | ICD-10-CM

## 2024-04-12 DIAGNOSIS — E7800 Pure hypercholesterolemia, unspecified: Secondary | ICD-10-CM

## 2024-04-12 DIAGNOSIS — M19042 Primary osteoarthritis, left hand: Secondary | ICD-10-CM

## 2024-04-12 DIAGNOSIS — R202 Paresthesia of skin: Secondary | ICD-10-CM

## 2024-04-12 MED ORDER — ALBUTEROL SULFATE HFA 108 (90 BASE) MCG/ACT IN AERS: 2.0000 | INHALATION_SPRAY | Freq: Four times a day (QID) | RESPIRATORY_TRACT | 5 refills | Status: AC | PRN

## 2024-04-12 MED ORDER — ZOLPIDEM TARTRATE 10 MG PO TABS: 10.0000 mg | ORAL_TABLET | Freq: Every evening | ORAL | 2 refills | Status: DC | PRN

## 2024-04-12 MED ORDER — OXYCODONE-ACETAMINOPHEN 5-325 MG PO TABS
1.0000 | ORAL_TABLET | ORAL | 0 refills | Status: DC | PRN
Start: 2024-04-12 — End: 2024-04-27

## 2024-04-12 MED ORDER — ATORVASTATIN CALCIUM 20 MG PO TABS: 20.0000 mg | ORAL_TABLET | Freq: Every day | ORAL | 1 refills | Status: AC

## 2024-04-12 MED ORDER — PREDNISONE 20 MG PO TABS: ORAL_TABLET | ORAL | 0 refills | Status: DC

## 2024-04-12 MED ORDER — AMLODIPINE BESYLATE 10 MG PO TABS: 10.0000 mg | ORAL_TABLET | Freq: Every day | ORAL | 1 refills | Status: AC

## 2024-04-12 NOTE — Telephone Encounter (Signed)
 Please notify patient that [facility] and they confirmed that she has already picked up the oxycodone  pain medication today. It looks like she called and notified of this message and then she actually filled the medication.  So I cannot switch her pain medicine to [facility].  The pharmacist said that the Zolpidem  Ambien  medication is not due until October, but she picked it up on 04/07/24 so she should not have an issue with that now.  Let me know if anything else needs to be done, but I believe the situation is solved.  [name], DO [facility] 04/12/2024, 1:20 PM

## 2024-04-12 NOTE — Progress Notes (Signed)
 Subjective:    Patient ID: Aleayah Chico, female    DOB: [birth year], 58 y.o.   MRN: [id number]  Desiraye Rolfson is a 59 y.o. female presenting on 04/12/2024 for Medical Management of Chronic Issues   HPI  Discussed the use of AI scribe software for clinical note transcription with the patient, who gave verbal consent to proceed.  History of Present Illness   Willadean Guyton is a 58 year old female with carpal tunnel syndrome and joint pain who presents with worsening pain and medication inefficacy.   Insomnia Controlled on med Needs refill on Ambien  - Uses a sleeping pill, which is effective   Centrilobular Emphysema Not able to afford Breztri , not covered Needs new maintenance inhaler Rx on Albuterol     Bilateral Hand Wrist Pain Left Carpal Tunnel Syndrome Neck Pain, Cervicogenic Headaches Paresthesia Left Arm   Background history - Last visit with me 01/19/23 with me for Arthritis hands and R Carpal Tunnel, initially had R thumb, index and middle finger and wrist pain. Diagnosed clinically and given treatment course with Tramadol , wrist splint, referral to Neurology.   She has established with [facility] Neurology already, last seen 02/22/23 for initial consult, and setup with [facility] on 03/21/23. She had cortisone injection with temporary relief.   The patient, with a history of left carpal tunnel syndrome and left arm nerve impingement, presents with worsening pain in the left hand and arm. The pain, initially localized to the thumb, index, and middle fingers, has now extended to involve the entire hand and arm up to the shoulder. The patient describes the pain as a sensation of stiffness, puffiness, and tingling, likening it to a lack of blood circulation and the feeling of the hand 'blowing up.' The pain is severe enough to cause sleep disturbances and emotional distress.   The patient reports a temporary relief from pain following a cortisone injection, which lasted for  about a month and a half.   She has tried various treatments including Gabapentin , Lyrica , NSAID, Tramadol , Hydrocodone , Prednisone , Tizanidine.  Topical treatments, including lidocaine  and Tiger Balm, have provided temporary relief but are not long-lasting.   Additionally, her health insurance plan [facility] is no longer covering specialist care with her Orthopedic or Neuro and she could not get other tests needed. Neurology ordered EMG + MRI. She has been unable to schedule these tests   Latest update - Severe, persistent pain in both hands, with worsening intensity over time - Morning stiffness and swelling, particularly in the knuckles - Knuckles appear swollen and are painful - Hydrocodone  taken three to five times daily, but no longer provides relief - Meloxicam  previously used for inflammation, but not effective and not used recently - Steroid injections and Lyrica  (pregabalin ) provided only temporary relief - Increased hydrocodone  use above prescribed dose due to inefficacy        07/06/2023    3:43 PM 11/10/2022    1:07 PM  Depression screen PHQ 2/9  Decreased Interest 3 1  Down, Depressed, Hopeless 3 1  PHQ - 2 Score 6 2  Altered sleeping 3 3  Tired, decreased energy 2 1  Change in appetite 0 0  Feeling bad or failure about yourself  2 0  Trouble concentrating 0 1  Moving slowly or fidgety/restless 0 0  Suicidal thoughts 0 0  PHQ-9 Score 13 7       07/06/2023    3:43 PM  GAD 7 : Generalized Anxiety Score  Nervous, Anxious, on Edge  1  Control/stop worrying 0  Worry too much - different things 0  Trouble relaxing 3  Restless 0  Easily annoyed or irritable 1  Afraid - awful might happen 0  Total GAD 7 Score 5    Social History   Tobacco Use   Smoking status: Every Day    Types: E-cigarettes   Smokeless tobacco: Never  Vaping Use   Vaping status: Former   Substances: Nicotine, Flavoring  Substance Use Topics   Alcohol use: Not Currently    Comment:  last use 2021   Drug use: Yes    Types: Marijuana    Comment: last use 08/22/22    Review of Systems Per HPI unless specifically indicated above     Objective:    BP 120/82 (BP Location: Right Arm, Patient Position: Sitting, Cuff Size: Normal)   Pulse 88   Ht 4' (1.219 m)   Wt 156 lb (70.8 kg)   LMP  (LMP Unknown)   SpO2 97%   BMI 47.60 kg/m   Wt Readings from Last 3 Encounters:  04/12/24 156 lb (70.8 kg)  08/19/23 152 lb (68.9 kg)  07/06/23 153 lb (69.4 kg)    Physical Exam Vitals and nursing note reviewed.  Constitutional:      General: She is not in acute distress.    Appearance: Normal appearance. She is well-developed. She is not diaphoretic.     Comments: Well-appearing, comfortable, cooperative  HENT:     Head: Normocephalic and atraumatic.  Eyes:     General:        Right eye: No discharge.        Left eye: No discharge.     Conjunctiva/sclera: Conjunctivae normal.  Cardiovascular:     Rate and Rhythm: Normal rate.  Pulmonary:     Effort: Pulmonary effort is normal.  Musculoskeletal:     Comments: Bilateral hands with bulky MCP and PIP DIP joints some stiffness reduced ROM.  Left hand wrist with some rigidity stiffness due to pain with movement.  Skin:    General: Skin is warm and dry.     Findings: No erythema or rash.  Neurological:     Mental Status: She is alert and oriented to person, place, and time.  Psychiatric:        Mood and Affect: Mood normal.        Behavior: Behavior normal.        Thought Content: Thought content normal.     Comments: Well groomed, good eye contact, normal speech and thoughts     Results for orders placed or performed during the hospital encounter of 08/19/23  Resp panel by RT-PCR (RSV, Flu A&B, Covid) Anterior Nasal Swab   Collection Time: 08/19/23  7:03 AM   Specimen: Anterior Nasal Swab  Result Value Ref Range   SARS Coronavirus 2 by RT PCR NEGATIVE NEGATIVE   Influenza A by PCR NEGATIVE NEGATIVE   Influenza  B by PCR NEGATIVE NEGATIVE   Resp Syncytial Virus by PCR NEGATIVE NEGATIVE  Basic metabolic panel   Collection Time: 08/19/23  7:07 AM  Result Value Ref Range   Sodium 140 135 - 145 mmol/L   Potassium 3.1 (L) 3.5 - 5.1 mmol/L   Chloride 100 98 - 111 mmol/L   CO2 28 22 - 32 mmol/L   Glucose, Bld 141 (H) 70 - 99 mg/dL   BUN 13 6 - 20 mg/dL   Creatinine, Ser 9.41 0.44 - 1.00 mg/dL   Calcium  8.7 (  L) 8.9 - 10.3 mg/dL   GFR, Estimated >39 >39 mL/min   Anion gap 12 5 - 15  CBC   Collection Time: 08/19/23  7:07 AM  Result Value Ref Range   WBC 6.4 4.0 - 10.5 K/uL   RBC 4.58 3.87 - 5.11 MIL/uL   Hemoglobin 13.6 12.0 - 15.0 g/dL   HCT 60.4 63.9 - 53.9 %   MCV 86.2 80.0 - 100.0 fL   MCH 29.7 26.0 - 34.0 pg   MCHC 34.4 30.0 - 36.0 g/dL   RDW 87.7 88.4 - 84.4 %   Platelets 187 150 - 400 K/uL   nRBC 0.0 0.0 - 0.2 %  T4, free   Collection Time: 08/19/23  7:07 AM  Result Value Ref Range   Free T4 0.65 0.61 - 1.12 ng/dL  Hepatic function panel   Collection Time: 08/19/23  7:07 AM  Result Value Ref Range   Total Protein 6.7 6.5 - 8.1 g/dL   Albumin 3.9 3.5 - 5.0 g/dL   AST 32 15 - 41 U/L   ALT 28 0 - 44 U/L   Alkaline Phosphatase 81 38 - 126 U/L   Total Bilirubin 0.9 0.0 - 1.2 mg/dL   Bilirubin, Direct 0.1 0.0 - 0.2 mg/dL   Indirect Bilirubin 0.8 0.3 - 0.9 mg/dL  Lipase, blood   Collection Time: 08/19/23  7:07 AM  Result Value Ref Range   Lipase 35 11 - 51 U/L  TSH   Collection Time: 08/19/23  7:07 AM  Result Value Ref Range   TSH 1.906 0.350 - 4.500 uIU/mL  Troponin I (High Sensitivity)   Collection Time: 08/19/23  7:07 AM  Result Value Ref Range   Troponin I (High Sensitivity) 10 <18 ng/L  D-dimer, quantitative   Collection Time: 08/19/23  8:00 AM  Result Value Ref Range   D-Dimer, Quant <0.27 0.00 - 0.50 ug/mL-FEU  Troponin I (High Sensitivity)   Collection Time: 08/19/23  8:48 AM  Result Value Ref Range   Troponin I (High Sensitivity) 9 <18 ng/L      Assessment &  Plan:   Problem List Items Addressed This Visit     Centrilobular emphysema (HCC)   Relevant Medications   albuterol  (VENTOLIN  HFA) 108 (90 Base) MCG/ACT inhaler   predniSONE  (DELTASONE ) 20 MG tablet   Other Relevant Orders   AMB Referral VBCI Care Management   Essential hypertension   Relevant Medications   amLODipine  (NORVASC ) 10 MG tablet   atorvastatin  (LIPITOR) 20 MG tablet   Hypercholesterolemia   Relevant Medications   amLODipine  (NORVASC ) 10 MG tablet   atorvastatin  (LIPITOR) 20 MG tablet   Primary osteoarthritis of both hands   Relevant Medications   predniSONE  (DELTASONE ) 20 MG tablet   oxyCODONE -acetaminophen  (PERCOCET/ROXICET) 5-325 MG tablet   Other Visit Diagnoses       Carpal tunnel syndrome on left    -  Primary   Relevant Medications   predniSONE  (DELTASONE ) 20 MG tablet   zolpidem  (AMBIEN ) 10 MG tablet   oxyCODONE -acetaminophen  (PERCOCET/ROXICET) 5-325 MG tablet     Cervicogenic headache       Relevant Medications   amLODipine  (NORVASC ) 10 MG tablet   predniSONE  (DELTASONE ) 20 MG tablet   oxyCODONE -acetaminophen  (PERCOCET/ROXICET) 5-325 MG tablet     Paresthesia and pain of left extremity       Relevant Medications   predniSONE  (DELTASONE ) 20 MG tablet   oxyCODONE -acetaminophen  (PERCOCET/ROXICET) 5-325 MG tablet     Primary insomnia  Relevant Medications   zolpidem  (AMBIEN ) 10 MG tablet        Chronic Bilateral Hand joint pain and swelling Left Carpal Tunnel Syndrome Chronic problem with advance osteoarthritis and joint / nerve symptoms upper extremities Increased severity and stiffness, particularly in the hands.  Tried various therapies including gabapentin , lyrica , nsaids, prednisone  burst, muscle relaxants, opioids  Current hydrocodone  regimen ineffective. Potential nerve compression possibly related to carpal tunnel syndrome. - Switch hydrocodone  to oxycodone -acetaminophen  5/325 for pain management. Given she was needing increased  amount of hydrocodone  allow hand / wrist function. Current orders for up to 4 pills per day max for 14 day. We can consider dose adjustment. I advised her that ultimately I am helping bridge her to get better insurance coverage so she can follow up and proceed w/ Neuro / Hand Ortho to pursue carpal tunnel procedure or other treatment plan. If she requires advanced pain management we would have to refer her as well. - Prescribe a 12-day course of steroids to reduce inflammation. She can repeat prednisone  burst maybe 2-3 times a year, but we need to limit it due to safety side effect risks - Monitor response to oxycodone  and adjust dosage if necessary.  Carpal tunnel syndrome Confirmed diagnosis with symptoms of tingling and nerve compression. Surgery indicated but delayed due to insurance coverage issues. - Await insurance coverage to proceed with surgical intervention.  Centrilobular emphysema Breathing difficulties exacerbated by lack of inhaler due to cost and insurance coverage issues. - Refer to pharmacist VBCI program to explore financial assistance options - Prescribe a rescue inhaler and assess affordability at the pharmacy.  Hypertension Management requires consistent medication adherence. Reports of headaches possibly related to missed doses. Refill Amlodipine  10mg   Hyperlipidemia Refill Atorvastatin   Insomnia Managed with sleeping pills, which have been effective. Refill Zolplidem       Orders Placed This Encounter  Procedures   AMB Referral VBCI Care Management    Referral Priority:   Routine    Referral Type:   Consultation    Referral Reason:   Care Coordination    Number of Visits Requested:   1    Meds ordered this encounter  Medications   albuterol  (VENTOLIN  HFA) 108 (90 Base) MCG/ACT inhaler    Sig: Inhale 2 puffs into the lungs every 6 (six) hours as needed for wheezing or shortness of breath.    Dispense:  8 g    Refill:  5   amLODipine  (NORVASC ) 10 MG  tablet    Sig: Take 1 tablet (10 mg total) by mouth daily.    Dispense:  90 tablet    Refill:  1   atorvastatin  (LIPITOR) 20 MG tablet    Sig: Take 1 tablet (20 mg total) by mouth daily.    Dispense:  90 tablet    Refill:  1   predniSONE  (DELTASONE ) 20 MG tablet    Sig: Take 3 tablets daily (60mg ) for 4 days, take 2 tab daily (40mg ) for 4 days, take 1 tab daily (20mg ) for 4 days    Dispense:  24 tablet    Refill:  0   zolpidem  (AMBIEN ) 10 MG tablet    Sig: Take 1 tablet (10 mg total) by mouth at bedtime as needed. for sleep    Dispense:  30 tablet    Refill:  2    Not to exceed 3 additional fills before 10/23/2023 DX Code Needed  F51.01   oxyCODONE -acetaminophen  (PERCOCET/ROXICET) 5-325 MG tablet    Sig: Take  1 tablet by mouth every 4 (four) hours as needed for severe pain (pain score 7-10).    Dispense:  56 tablet    Refill:  0    Switch from Hydrocodone , change of therapy to Oxycodone     Follow up plan: Return if symptoms worsen or fail to improve.    [name], DO [facility] 04/12/2024, 11:39 AM

## 2024-04-12 NOTE — Telephone Encounter (Signed)
 Copied from CRM [id number]. Topic: Clinical - Prescription Issue >> Apr 12, 2024 12:25 PM Donna BRAVO wrote: Reason for CRM: patient got a message from [id number] [place], [place] - [address] [place] [address] Phone: [phone] Fax: [phone] oxyCODONE -acetaminophen  (PERCOCET/ROXICET) 5-325 MG tablet  [facility] until April 26, 2024  Patient would like the prescription sent to  [facility]  [address] [place], [place] [address] Phone [phone]  Patient is in pain and would like this taken care of right away

## 2024-04-12 NOTE — Patient Instructions (Addendum)
 Thank you for coming to the office today.    Please schedule a Follow-up Appointment to: Return if symptoms worsen or fail to improve.  If you have any other questions or concerns, please feel free to call the office or send a message through MyChart. You may also schedule an earlier appointment if necessary.  Additionally, you may be receiving a survey about your experience at our office within a few days to 1 week by e-mail or mail. We value your feedback.  [name], DO [facility], [place]

## 2024-04-13 ENCOUNTER — Telehealth: Payer: Self-pay | Admitting: Pharmacist

## 2024-04-13 NOTE — Progress Notes (Signed)
   Outreach Note  04/13/2024 Name: Gloria Perkins MRN: [id number] DOB: August 21, 1965  Referred by: Edman Marsa PARAS, DO Reason for referral : Medication Assistance  Received referral from PCP requesting outreach to patient for med assistance with emphysema/COPD maintenance inhaler   Was unable to reach patient via telephone today and have left HIPAA compliant voicemail asking patient to return my call.     Sharyle Sia, PharmD, JAQUELINE, CPP Clinical Pharmacist [facility] [phone]

## 2024-04-16 ENCOUNTER — Other Ambulatory Visit: Admitting: Pharmacist

## 2024-04-16 ENCOUNTER — Encounter: Payer: Self-pay | Admitting: Pharmacist

## 2024-04-16 DIAGNOSIS — J432 Centrilobular emphysema: Secondary | ICD-10-CM

## 2024-04-16 MED ORDER — BREZTRI AEROSPHERE 160-9-4.8 MCG/ACT IN AERO: 2.0000 | INHALATION_SPRAY | Freq: Two times a day (BID) | RESPIRATORY_TRACT | 2 refills | Status: AC

## 2024-04-16 NOTE — Progress Notes (Signed)
 04/16/2024 Name: Kiaira Pointer MRN: [id number] DOB: Jun 03, 1966  Chief Complaint  Patient presents with   Medication Assistance    Shelita Steptoe is a 58 y.o. year old female who presented for a telephone visit.   They were referred to the pharmacist by their PCP for assistance in managing medication access for maintenance inhaler such as Breztri .    Subjective:  Care Team: Primary Care Provider: Edman Marsa PARAS, DO   Medication Access/Adherence  [facility]acy[facility]  [phone] [place], [place] - [address] [place] [address] Phone: [phone] Fax: [phone]  [id number] [place], [place] - [address] AT [facility] OF [address] [place] [address] Phone: [phone] Fax: [phone]   Patient reports affordability concerns with their medications: Yes  Patient reports access/transportation concerns to their pharmacy: No  Patient reports adherence concerns with their medications:  No     COPD:  Current medications: albuterol  HFA - 2 puffs every 6 hours as needed for wheezing/shortness of breath  Medications tried in the past: Breztri  (cost)  Reports had good control of her breathing with Breztri  in the past as prescribed by PCP, but was unable to afford the cost of this medication.  Reports currently without maintenance inhaler and needing her albuterol  multiple times/day   Objective:   Lab Results  Component Value Date   CREATININE 0.58 08/19/2023   BUN 13 08/19/2023   NA 140 08/19/2023   K 3.1 (L) 08/19/2023   CL 100 08/19/2023   CO2 28 08/19/2023     Current Outpatient Medications on File Prior to Visit  Medication Sig Dispense Refill   albuterol  (VENTOLIN  HFA) 108 (90 Base) MCG/ACT inhaler Inhale 2 puffs into the lungs every 6 (six) hours as needed for wheezing or shortness of breath. 8 g 5   amLODipine  (NORVASC ) 10 MG tablet Take 1 tablet (10 mg total) by mouth daily. 90 tablet  1   atorvastatin  (LIPITOR) 20 MG tablet Take 1 tablet (20 mg total) by mouth daily. 90 tablet 1   oxyCODONE -acetaminophen  (PERCOCET/ROXICET) 5-325 MG tablet Take 1 tablet by mouth every 4 (four) hours as needed for severe pain (pain score 7-10). 56 tablet 0   predniSONE  (DELTASONE ) 20 MG tablet Take 3 tablets daily (60mg ) for 4 days, take 2 tab daily (40mg ) for 4 days, take 1 tab daily (20mg ) for 4 days 24 tablet 0   zolpidem  (AMBIEN ) 10 MG tablet Take 1 tablet (10 mg total) by mouth at bedtime as needed. for sleep 30 tablet 2   No current facility-administered medications on file prior to visit.        Assessment/Plan:   COPD: - Currently uncontrolled.  - Recommend to restart Breztri  maintenance inhaler 2 inhalations twice daily Reviewed appropriate inhaler technique, including importance of rinsing and spitting out after each use of Breztri  Discuss patient to continue to use albuterol  HFA inhaler as needed as directed - CPP sends prescription for Breztri  [facility]hal[facility] - Outreach to [facility] on behalf of patient to provide Breztri  savings card as downloaded from manufacturer website for patient:  BIN: [id number]  PCN: [id number]  Group: [id number]  ID: [id number] - [facility] processes Breztri  prescription through both patient's insurance and savings card and confirms copay is now $0 Follow up with patient to provide update. [facility] and restart Breztri  maintenance  inhaler    Follow Up Plan: Clinical Pharmacist will follow up with patient by telephone on 05/21/2024 at 4:30 PM   Sharyle Sia, PharmD, JAQUELINE, CPP Clinical Pharmacist [facility] [phone]

## 2024-04-16 NOTE — Patient Instructions (Signed)
 Please [facility] to [facility] and restart your Breztri  inhaler   [id number] [place], [place] - [address] [place] [address] Phone: [phone]   Reminders for using your Breztri  inhaler:   Please remember to prime your inhaler before using it for the first time. To do this, remove the cap from the mouthpiece, hold it upright, facing away from you, and shake well. Push the canister all the way down until it stops moving in the actuator to release a puff of medicine from the mouthpiece. You may hear a soft click from the dose indicator as it counts down. Repeat this process three times more.   To use the inhaler, take the cap off and shake the primed inhaler. Exhale as much as you reasonably can through your mouth, then close your lips around the mouthpiece and tilt your head back, keeping your tongue below the mouthpiece. Inhale deeply and slowly while pressing down on the canister until it stops moving. You should feel a puff of medicine. Remove the inhaler while holding your breath for as long as you comfortably can, up to 10 seconds, before breathing out gently. Repeat this process once more and replace the cap. You should be taking 2 puffs, 2 times a day. After your second puff, rinse your mouth with water to remove any excess medicine, making sure you don't swallow the water.   Just in case needed, the following is a copy of the Breztri  savings card:      Please feel free to give me a call with any medication related questions or concerns!   Sharyle Sia, PharmD, JAQUELINE, CPP Clinical Pharmacist [facility] [phone]

## 2024-04-27 ENCOUNTER — Encounter: Payer: Self-pay | Admitting: Family Medicine

## 2024-04-27 ENCOUNTER — Other Ambulatory Visit: Payer: Self-pay | Admitting: Family Medicine

## 2024-04-27 ENCOUNTER — Ambulatory Visit: Payer: Self-pay

## 2024-04-27 DIAGNOSIS — M19041 Primary osteoarthritis, right hand: Secondary | ICD-10-CM

## 2024-04-27 DIAGNOSIS — G5602 Carpal tunnel syndrome, left upper limb: Secondary | ICD-10-CM

## 2024-04-27 DIAGNOSIS — M79609 Pain in unspecified limb: Secondary | ICD-10-CM

## 2024-04-27 MED ORDER — OXYCODONE-ACETAMINOPHEN 7.5-325 MG PO TABS: 1.0000 | ORAL_TABLET | ORAL | 0 refills | Status: DC | PRN

## 2024-04-27 NOTE — Telephone Encounter (Signed)
 FYI Only or Action Required?: Action required by provider: medication refill request and increase of medication dosage.  Patient was last seen in primary care on 04/12/2024 by Edman Marsa PARAS, DO.  Called Nurse Triage reporting Medication Problem.   Symptoms are: gradually worsening.  Triage Disposition: Call PCP Now  Patient/caregiver understands and will follow disposition?: Yes      Copied from CRM #[id number]. Topic: Clinical - Prescription Issue >> Apr 27, 2024 12:42 PM [name] wrote: Reason for CRM: Patient is requesting a refill for oxyCODONE -acetaminophen  (PERCOCET/ROXICET) 5-325 MG tablet, patient is requesting a higher dosage because current dosage is no longer working.  [id number] [place], [place] - [address], [place] [address] Phone: [phone]  Fax: [phone] Reason for Disposition  [1] Caller has URGENT medicine question about med that primary care doctor (or NP/PA) or specialist prescribed AND [2] triager unable to answer question  Answer Assessment - Initial Assessment Questions Requesting a refill of medication at a higher dose. Patient requesting a MyChart message regarding her request.    1. NAME of MEDICINE: What medicine(s) are you calling about?     oxyCODONE -acetaminophen  (PERCOCET/ROXICET) 5-325 MG tablet 2. QUESTION: What is your question? (e.g., double dose of medicine, side effect)     Increase dose of medication  3. PRESCRIBER: Who prescribed the medicine? Reason: if prescribed by specialist, call should be referred to that group.     PCP 4. SYMPTOMS: Do you have any symptoms? If Yes, ask: What symptoms are you having?  How bad are the symptoms (e.g., mild, moderate, severe)     Patient states she has had severe pain in her hands due to medication not helping with symptoms.  Protocols used: Medication Question Call-A-AH

## 2024-04-27 NOTE — Telephone Encounter (Signed)
 This encounter was created in error - please disregard.

## 2024-04-27 NOTE — Telephone Encounter (Signed)
 Left message for patient to return call OK to schedule an appointment with Dr Edman to discuss medications

## 2024-04-27 NOTE — Telephone Encounter (Signed)
 FYI Only or Action Required?: FYI only for provider.  Patient was last seen in primary care on 04/12/2024 by Edman Marsa PARAS, DO.  Called Nurse Triage reporting Advice Only.  Symptoms began today.  Interventions attempted: Other: N/A.  Symptoms are: N/A.  Triage Disposition: Call PCP When Office is Open  Patient/caregiver understands and will follow disposition?: Yes  Copied from CRM [id number]. Topic: Clinical - Red Word Triage >> Apr 27, 2024  4:07 PM Fonda T wrote: Red Word that prompted transfer to Nurse Triage: Patient calling states he had to leave work early due to worsening pain, with shooting pain, and having difficulty with movement. Per patient had to leave work early due to intense pain. Reason for Disposition  Caller requesting a CONTROLLED substance prescription refill (e.g., narcotics, ADHD medicines)  Answer Assessment - Initial Assessment Questions Pt calls stating she has attempted to contact pcp for an increase in pain medication. States she was told she would have to schedule an appointment to discuss increase. Pt states she won't be able to do appointment for a few weeks and is now just requesting a refill of her medication at its prescribed dosage.   1. DRUG NAME: What medicine do you need to have refilled?     oxyCODONE -acetaminophen  (PERCOCET/ROXICET) 5-325 MG tablet 2. REFILLS REMAINING: How many refills are remaining? Notes: The label on the medicine or pill bottle will show how many refills are remaining. If there are no refills remaining, then a renewal may be needed.     unsure 3. EXPIRATION DATE: What is the expiration date? Note: The label states when the prescription will expire, and thus can no longer be refilled.)     unsure 4. PRESCRIBER: Who prescribed it? Note: The prescribing doctor or group is responsible for refill approvals..     DR. Marsa Edman 5. PHARMACY: Have you contacted your pharmacy (drugstore)? Note: Some  pharmacies will contact the doctor (or NP/PA).      denies 6. SYMPTOMS: Do you have any symptoms?     denies  Protocols used: Medication Refill and Renewal Call-A-AH

## 2024-04-27 NOTE — Telephone Encounter (Signed)
 Called patient. We did discuss dose increase. So I am okay to raise her dose as we planned. The Oxycodone  is more successful. I will raise from 5 to 7.5mg  dosing on percocet, new rx sent.  [name], DO [facility] 04/27/2024, 4:51 PM

## 2024-05-07 ENCOUNTER — Other Ambulatory Visit: Payer: Self-pay | Admitting: Family Medicine

## 2024-05-07 ENCOUNTER — Ambulatory Visit: Payer: Self-pay

## 2024-05-07 DIAGNOSIS — M19041 Primary osteoarthritis, right hand: Secondary | ICD-10-CM

## 2024-05-07 DIAGNOSIS — G5602 Carpal tunnel syndrome, left upper limb: Secondary | ICD-10-CM

## 2024-05-07 MED ORDER — OXYCODONE-ACETAMINOPHEN 10-325 MG PO TABS: 1.0000 | ORAL_TABLET | ORAL | 0 refills | Status: DC | PRN

## 2024-05-07 NOTE — Addendum Note (Signed)
 Addended by: EDMAN MARSA PARAS on: 05/07/2024 04:00 PM   Modules accepted: Orders

## 2024-05-07 NOTE — Telephone Encounter (Signed)
 FYI Only or Action Required?: Action required by provider: clinical question for provider and pt request different medication for pain..  Patient was last seen in primary care on 04/12/2024 by Edman Marsa PARAS, DO.  Called Nurse Triage reporting Advice Only and Pain.  Symptoms began about a month ago and worsening  Interventions attempted: Prescription medications: oxycodone : medication worked at first but no longer working..  Symptoms are: gradually worsening.   Please triage - nurse not able to bring over triage information into this note. Triage Disposition: Home Care, No Contact Calls  Patient/caregiver understands and will follow disposition?: Yes  Copied from CRM [id number]. Topic: Clinical - Medication Question >> May 07, 2024 12:22 PM Cleave MATSU wrote: Reason for CRM: pt said the higher mg oxycodone  that was prescribed to her isnt working for her hand. She has carpal tunnel. Please follow up with pt to assist

## 2024-05-07 NOTE — Telephone Encounter (Signed)
 Reason for Disposition . Caused by overuse from recent vigorous activity (e.g., heavy lifting, repetitive motions, sports)    Routing information to PCP per pt request.  Answer Assessment - Initial Assessment Questions 1. ONSET: When did the pain start?     Left hand and radiating into left shoulder 2. LOCATION: Where is the pain located?     Left hand 3. PAIN: How bad is the pain? (Scale 1-10; or mild, moderate, severe)     10/10 4. WORK OR EXERCISE: Has there been any recent work or exercise that involved this part (i.e., hand or wrist) of the body?     no 5. CAUSE: What do you think is causing the pain?     Carpal tunnel and degenerative arthritis 6. AGGRAVATING FACTORS: What makes the pain worse? (e.g., using computer)     movement 7. OTHER SYMPTOMS: Do you have any other symptoms? (e.g., fever, neck pain, numbness or tingling, rash, swelling)     Pain, tingling, numbness,  8. PREGNANCY: Is there any chance you are pregnant? When was your last menstrual period?     Na  LOV about a month ago.  Pt asked to inform PCP that the higher dose of oxycodone  is no longer working and pt is unable to get relief.  Nurse asked pt are they able to open and close hand:pt stated no.  Nurse asked if this was something new r/t not being able to open/close hand: pt did not answer: pt stated on 10 minute break and had to disconnect call.  Nurse will route this information to PCP.  Protocols used: Hand Pain-A-AH

## 2024-05-07 NOTE — Telephone Encounter (Signed)
 Called patient. Advised her next dose up on oxycodone  is highest dose I can prescribe. I advised that she needs to proceed w/ orthopedic for procedure next because I am out of solutions. I will agree to order the the 10/325 oxycodone  dose, and she asked if generic is weaker than brand name, I advised that generic should still work. I am not sure why it is only working temporarily, she has tried every other medicine at this point. Noted she did not take her prednisone  from last visit bc forgot how to dose it without AVS and it was not on bottle from pharmacy.  Gave her instruction on prednisone  taper 20mg  x 3 tab for 4 days, 20mg  x 2 tab for 4 days, and 20mg  x 1 tab for 4 days, 12 day taper, she can start and I will send dose inc oxycodone  to 10/325 and that is last dose increase I can do, she understands next step is orthopedics  [name], DO [facility] 05/07/2024, 4:00 PM

## 2024-05-07 NOTE — Telephone Encounter (Signed)
 Copied from CRM #[id number]. Topic: Clinical - Medication Question >> May 07, 2024 12:22 PM Cleave MATSU wrote: Reason for CRM: pt said the higher mg oxycodone  that was prescribed to her isnt working for her hand. She has carpal tunnel. Please follow up with pt to assist

## 2024-05-07 NOTE — Telephone Encounter (Signed)
 Unable to reach patient for triage x 3 attempts.

## 2024-05-21 ENCOUNTER — Other Ambulatory Visit

## 2024-05-21 ENCOUNTER — Other Ambulatory Visit: Payer: Self-pay | Admitting: Family Medicine

## 2024-05-21 ENCOUNTER — Ambulatory Visit: Payer: Self-pay | Admitting: Family Medicine

## 2024-05-21 ENCOUNTER — Telehealth: Payer: Self-pay | Admitting: Pharmacist

## 2024-05-21 DIAGNOSIS — M19042 Primary osteoarthritis, left hand: Secondary | ICD-10-CM

## 2024-05-21 DIAGNOSIS — G5602 Carpal tunnel syndrome, left upper limb: Secondary | ICD-10-CM

## 2024-05-21 MED ORDER — OXYCODONE-ACETAMINOPHEN 10-325 MG PO TABS
1.0000 | ORAL_TABLET | ORAL | 0 refills | Status: DC | PRN
Start: 2024-05-21 — End: 2024-06-04

## 2024-05-21 NOTE — Progress Notes (Signed)
   Outreach Note  05/21/2024 Name: Ellarae Nevitt MRN: [id number] DOB: 05-Jun-1966  Referred by: Edman Marsa PARAS, DO  Was unable to reach patient via telephone today and have left HIPAA compliant voicemail asking patient to return my call.    Follow Up Plan: Will collaborate with Care Guide to outreach to schedule follow up with me  Sharyle Sia, PharmD, JAQUELINE, CPP Clinical Pharmacist [facility] [phone]

## 2024-05-21 NOTE — Telephone Encounter (Signed)
 FYI Only or Action Required?: Action required by provider: update on patient condition.  Patient was last seen in primary care on 04/12/2024 by Edman Marsa PARAS, DO.  Called Nurse Triage reporting Medication Reaction.  Symptoms began several days ago.  Triage Disposition: Call PCP Now  Patient/caregiver understands and will follow disposition?: Yes                         Copied from CRM #[id number]. Topic: Clinical - Red Word Triage >> May 21, 2024  3:08 PM Mia F wrote: Red Word that prompted transfer to Nurse Triage: Pt is on a COPD medication (budesonide-glycopyrrolate-formoterol (BREZTRI  AEROSPHERE) 160-9-4.8 MCG/ACT AERO inhaler) she think she is having a reaction to the medication. She says she Friday she was feeling very week and hurting. She has been having diarreah and stomach pain since Friday. She also mentions having chest pain and bodyaches. She says she stoppped the medicatoions but still having stomach pain Reason for Disposition  [1] Caller has URGENT medicine question about med that primary care doctor (or NP/PA) or specialist prescribed AND [2] triager unable to answer question  Answer Assessment - Initial Assessment Questions This RN offered to schedule pt an appointment but pt declined and states she will call back tmrw if she is still having symptoms. This RN will send a high priority message to clinic for PCP to review. This RN educated pt on  new-worsening symptoms and when to call back/seek emergent care. Pt verbalized understanding and agrees to plan.   Pt started taking the medication (budesonide-glycopyrrolate-formoterol (BREZTRI  AEROSPHERE) 160-9-4.8 MCG/ACT AERO inhaler) last Wed. Pt states she was forgetting to rinse it out of her mouth. Pt states the symptoms started on Fri- body aches (like a car had hit me), stomach bubbling/cramps, and passing gas. Pt had a bm that was all like water, mouth dryness. Pt realized she is having side  effects from the medication. Pt stopped taking it on Fri.  Only symptoms still having are the stomach cramps and diarrhea (not as loose, x4 in past 24 hours)  Protocols used: Medication Question Call-A-AH

## 2024-05-21 NOTE — Telephone Encounter (Signed)
 Copied from CRM #[id number]. Topic: Clinical - Medication Refill >> May 21, 2024  3:07 PM Mia F wrote: Medication: oxyCODONE -acetaminophen  (PERCOCET) 10-325 MG tablet   Has the patient contacted their pharmacy? Yes (Agent: If no, request that the patient contact the pharmacy for the refill. If patient does not wish to contact the pharmacy document the reason why and proceed with request.) (Agent: If yes, when and what did the pharmacy advise?)  This is the patient's preferred pharmacy:  [id number] [place] [place] - [address] [place] [address] Phone: [phone] Fax: [phone]   Is this the correct pharmacy for this prescription? Yes If no, delete pharmacy and type the correct one.   Has the prescription been filled recently? Yes  Is the patient out of the medication? No  Has the patient been seen for an appointment in the last year OR does the patient have an upcoming appointment? Yes  Can we respond through MyChart? No  Agent: Please be advised that Rx refills may take up to 3 business days. We ask that you follow-up with your pharmacy.

## 2024-05-21 NOTE — Telephone Encounter (Signed)
 Diarrhea and GI Upset / stomach cramps could be side effects of Breztri . Rinsing of the mouth is definitely encouraged to minimize side effects or complications.   It looks like our clinical pharmacist Sharyle should be re-scheduled to talk to her soon as well if she has further questions on this medication.  If too many side effects, we will need to find alternative, but most inhalers can cause similar symptoms side effects if mouth not rinsed and the medicine hangs around.  [name], DO [facility] 05/21/2024, 7:09 PM

## 2024-05-22 ENCOUNTER — Other Ambulatory Visit: Payer: Self-pay | Admitting: Family Medicine

## 2024-05-22 DIAGNOSIS — R197 Diarrhea, unspecified: Secondary | ICD-10-CM

## 2024-05-22 DIAGNOSIS — R109 Unspecified abdominal pain: Secondary | ICD-10-CM

## 2024-05-22 MED ORDER — DICYCLOMINE HCL 10 MG PO CAPS: 10.0000 mg | ORAL_CAPSULE | Freq: Three times a day (TID) | ORAL | 0 refills | Status: DC

## 2024-05-22 NOTE — Telephone Encounter (Signed)
 Spoke with patient, gave her all instructions with verbal understanding

## 2024-05-22 NOTE — Addendum Note (Signed)
 Addended by: EDMAN MARSA PARAS on: 05/22/2024 08:18 AM   Modules accepted: Orders

## 2024-05-22 NOTE — Telephone Encounter (Signed)
 Yes I will send Dicyclomine  (Bentyl ) medication. Take as needed with meals and or bedtime up to 4 times per day as needed for reducing diarrhea, bowel urgency, and abdominal cramping as it treats spasms of colon.  The only other diarrhea medication for short term acute use would be Imodium AD which is OTC.  [name], DO [facility] 05/22/2024, 8:17 AM

## 2024-05-22 NOTE — Telephone Encounter (Signed)
 She is wondering if there is anything we can prescribe for her to help with diarrhea and cramps

## 2024-05-29 ENCOUNTER — Other Ambulatory Visit: Payer: Self-pay | Admitting: Family Medicine

## 2024-05-29 DIAGNOSIS — R197 Diarrhea, unspecified: Secondary | ICD-10-CM

## 2024-05-29 DIAGNOSIS — R109 Unspecified abdominal pain: Secondary | ICD-10-CM

## 2024-05-31 NOTE — Telephone Encounter (Signed)
 Requested medication (s) are due for refill today:   Requested medication (s) are on the active medication list: Yes  Last refill:  05/22/24  Future visit scheduled: No  Notes to clinic:  See pharmacy request.    Requested Prescriptions  Pending Prescriptions Disp Refills   dicyclomine  (BENTYL ) 10 MG capsule [Pharmacy Med Name: DICYCLOMINE  10 MG CAPSULE] 360 capsule 1    Sig: Take 1 capsule (10 mg total) by mouth 4 (four) times daily -  before meals and at bedtime. As needed for diarrhea, abdominal cramping     Gastroenterology:  Antispasmodic Agents Passed - 05/31/2024 12:20 PM      Passed - Valid encounter within last 12 months    Recent Outpatient Visits           1 month ago Carpal tunnel syndrome on left   [facility] [place], Marsa PARAS, [place]

## 2024-06-04 ENCOUNTER — Ambulatory Visit: Payer: Self-pay

## 2024-06-04 ENCOUNTER — Other Ambulatory Visit: Payer: Self-pay | Admitting: Family Medicine

## 2024-06-04 DIAGNOSIS — M19041 Primary osteoarthritis, right hand: Secondary | ICD-10-CM

## 2024-06-04 DIAGNOSIS — G5602 Carpal tunnel syndrome, left upper limb: Secondary | ICD-10-CM

## 2024-06-04 NOTE — Telephone Encounter (Signed)
 First attempt to contact patient for triage. LVM for patient to return call to [phone]. Placed in callbacks for additional attempts  Copied from CRM #[id number]. Topic: Clinical - Medication Question >> Jun 04, 2024  9:42 AM Victoria B wrote: Reason for CRM: Patient needs to know how to take the med, predniSONE  (DELTASONE ) 20 MG tablet

## 2024-06-04 NOTE — Telephone Encounter (Signed)
 3 attempts to contact pt, no contact made, VM's left for pt to call clinic back. Routing to clinic.

## 2024-06-04 NOTE — Telephone Encounter (Signed)
 Copied from CRM [id number]. Topic: Clinical - Medication Refill >> Jun 04, 2024  8:49 AM Tiffany B wrote: Medication:  oxyCODONE -acetaminophen  (PERCOCET) 10-325 MG tablet  Has the patient contacted their pharmacy? Yes   (Agent: If yes, when and what did the pharmacy advise?) Contact PCP office   This is the patient's preferred pharmacy:  [id number] [place], [place] - [address] [place] [address] Phone: [phone] Fax: [phone]   Is this the correct pharmacy for this prescription? Yes If no, delete pharmacy and type the correct one.   Has the prescription been filled recently? Yes  Is the patient out of the medication? Yes  Has the patient been seen for an appointment in the last year OR does the patient have an upcoming appointment? Yes  Can we respond through MyChart? No  Agent: Please be advised that Rx refills may take up to 3 business days. We ask that you follow-up with your pharmacy.

## 2024-06-05 ENCOUNTER — Telehealth: Payer: Self-pay

## 2024-06-05 MED ORDER — OXYCODONE-ACETAMINOPHEN 10-325 MG PO TABS: 1.0000 | ORAL_TABLET | ORAL | 0 refills | Status: DC | PRN

## 2024-06-05 NOTE — Telephone Encounter (Signed)
 Please let her know her pain medicine was sent to the pharmacy today. The refill request came in yesterday and it had not made it to my inbox yet, I have now reviewed and signed the order.  [name], DO [facility] 06/05/2024, 11:00 AM

## 2024-06-05 NOTE — Telephone Encounter (Signed)
 Copied from CRM [id number]. Topic: Clinical - Medication Question >> Jun 05, 2024  9:43 AM Dedra B wrote: Reason for CRM: Pt called to follow up on refill request for oxyCODONE -acetaminophen  (PERCOCET) 10-325 MG tablet. Pt said she is out and needs it for her carpal tunnel.

## 2024-06-08 ENCOUNTER — Telehealth: Payer: Self-pay

## 2024-06-08 NOTE — Progress Notes (Signed)
 Complex Care Management Note Care Guide Note  06/08/2024 Name: Gloria Perkins MRN: [id number] DOB: [birth year]   Complex Care Management Outreach Attempts: An unsuccessful telephone outreach was attempted today to offer the patient information about available complex care management services.  Follow Up Plan:  Additional outreach attempts will be made to offer the patient complex care management information and services.   Encounter Outcome:  No Answer  Jeoffrey Buffalo , RMA     [place]  [facility], [facility] Guide  Direct Dial: [phone]  Website: [email or web address]

## 2024-06-15 NOTE — Progress Notes (Signed)
 Complex Care Management Care Guide Note  06/15/2024 Name: Gloria Perkins MRN: [id number] DOB: 1965-09-09  Clorinda Wyble is a 58 y.o. year old female who is a primary care patient of Edman Marsa PARAS, DO and is actively engaged with the care management team. I reached out to Samule Gull by phone today to assist with re-scheduling  with the Pharmacist.  Follow up plan: Unsuccessful telephone outreach attempt made. A HIPAA compliant phone message was left for the patient providing contact information and requesting a return call.  Jeoffrey Buffalo , RMA     [facility]  [facility], [facility] Guide  Direct Dial: [phone]  Website: [email or web address]

## 2024-06-16 ENCOUNTER — Other Ambulatory Visit: Payer: Self-pay | Admitting: Family Medicine

## 2024-06-16 DIAGNOSIS — R109 Unspecified abdominal pain: Secondary | ICD-10-CM

## 2024-06-16 DIAGNOSIS — R197 Diarrhea, unspecified: Secondary | ICD-10-CM

## 2024-06-18 NOTE — Telephone Encounter (Signed)
 Requested Prescriptions  Pending Prescriptions Disp Refills   dicyclomine  (BENTYL ) 10 MG capsule [Pharmacy Med Name: DICYCLOMINE  10 MG CAPSULE] 60 capsule 0    Sig: TAKE 1 CAPSULE (10 MG TOTAL) BY MOUTH 4 (FOUR) TIMES DAILY - BEFORE MEALS AND AT BEDTIME. AS NEEDED FOR DIARRHEA, ABDOMINAL CRAMPING     Gastroenterology:  Antispasmodic Agents Passed - 06/18/2024  2:50 PM      Passed - Valid encounter within last 12 months    Recent Outpatient Visits           2 months ago Carpal tunnel syndrome on left   [facility] [place], Marsa PARAS, [place]

## 2024-06-20 ENCOUNTER — Other Ambulatory Visit: Payer: Self-pay | Admitting: Family Medicine

## 2024-06-20 DIAGNOSIS — M19042 Primary osteoarthritis, left hand: Secondary | ICD-10-CM

## 2024-06-20 DIAGNOSIS — G5602 Carpal tunnel syndrome, left upper limb: Secondary | ICD-10-CM

## 2024-06-20 NOTE — Telephone Encounter (Signed)
 Copied from CRM [id number]. Topic: Clinical - Medication Refill >> Jun 20, 2024  8:58 AM Willma R wrote: Medication: oxyCODONE -acetaminophen  (PERCOCET) 10-325 MG tablet   Has the patient contacted their pharmacy? Yes,call dr  This is the patient's preferred pharmacy:  [id number] [place], [place] - [address] [place] [address] Phone: [phone] Fax: [phone]  Is this the correct pharmacy for this prescription? Yes If no, delete pharmacy and type the correct one.   Has the prescription been filled recently? No  Is the patient out of the medication? Yes  Has the patient been seen for an appointment in the last year OR does the patient have an upcoming appointment? Yes  Can we respond through MyChart? Yes  Agent: Please be advised that Rx refills may take up to 3 business days. We ask that you follow-up with your pharmacy.

## 2024-06-25 ENCOUNTER — Telehealth: Payer: Self-pay

## 2024-06-25 ENCOUNTER — Ambulatory Visit: Payer: Self-pay

## 2024-06-25 MED ORDER — OXYCODONE-ACETAMINOPHEN 10-325 MG PO TABS: 1.0000 | ORAL_TABLET | ORAL | 0 refills | Status: DC | PRN

## 2024-06-25 NOTE — Telephone Encounter (Signed)
 Requested medication (s) are due for refill today: yes  Requested medication (s) are on the active medication list: yes  Last refill:  06/05/24  Future visit scheduled: yes  Notes to clinic:  Unable to refill per protocol, cannot delegate.      Requested Prescriptions  Pending Prescriptions Disp Refills   oxyCODONE -acetaminophen  (PERCOCET) 10-325 MG tablet 56 tablet 0    Sig: Take 1 tablet by mouth every 4 (four) hours as needed for pain.     Not Delegated - Analgesics:  Opioid Agonist Combinations Failed - 06/25/2024 10:33 AM      Failed - This refill cannot be delegated      Failed - Urine Drug Screen completed in last 360 days      Passed - Valid encounter within last 3 months    Recent Outpatient Visits           2 months ago Carpal tunnel syndrome on left   [facility] [place], Marsa PARAS, [place]

## 2024-06-25 NOTE — Telephone Encounter (Signed)
 Copied from CRM [id number]. Topic: Clinical - Medication Refill >> Jun 20, 2024  8:58 AM Willma R wrote: Medication: oxyCODONE -acetaminophen  (PERCOCET) 10-325 MG tablet   Has the patient contacted their pharmacy? Yes,call dr  This is the patient's preferred pharmacy:  [id number] [place], [place] - [address] [place] [address] Phone: [phone] Fax: [phone]  Is this the correct pharmacy for this prescription? Yes If no, delete pharmacy and type the correct one.   Has the prescription been filled recently? No  Is the patient out of the medication? Yes  Has the patient been seen for an appointment in the last year OR does the patient have an upcoming appointment? Yes  Can we respond through MyChart? Yes  Agent: Please be advised that Rx refills may take up to 3 business days. We ask that you follow-up with your pharmacy.

## 2024-06-25 NOTE — Telephone Encounter (Signed)
 FYI Only or Action Required?: Action required by provider: medication refill request.  Patient was last seen in primary care on 04/12/2024 by Gloria Marsa PARAS, Gloria Perkins.  Called Nurse Triage reporting Arm Pain.  Symptoms began several days ago.  Interventions attempted: Rest, hydration, or home remedies.  Symptoms are: gradually worsening.  Triage Disposition: See PCP When Office is Open (Within 3 Days)  Patient/caregiver understands and will follow disposition?: No, wishes to speak with PCP    Copied from CRM #[id number]. Topic: Clinical - Red Word Triage >> Jun 25, 2024  9:26 AM Gloria Perkins wrote: Gloria Perkins that prompted transfer to Nurse Triage: Pt states she is having intense pain in both arms radiating to shoulders, with increased numbness and tingling, and swelling.    Pt states she has been out of pain medication for almost a week, and has been requesting refill of pain meds, and has not been sent to pharmacy. Reason for Disposition  [1] MODERATE pain (e.g., interferes with normal activities) AND [2] present > 3 days  Answer Assessment - Initial Assessment Questions Additional info: Patient called on 06/20/24 for refill of oxycodone , she is calling today very upset that prescription has not been sent. She took her last dose on Thursday and now bilateral arm and shoulder pain is uncontrolled. She states she has mild back pain but that is chronic. Denies chest pain and shortness of breath. Offered appointment for evaluation but she declines offer and asking for Gloria Perkins [facility] See Refill encounter 06/20/24.     1. ONSET: When did the pain start?     Chronic, but worse one week without oxycodone .  2. LOCATION: Where is the pain located?     Bilateral arms  3. PAIN: How bad is the pain? (Scale 0-10; or none, mild, moderate, severe)     severe 4. WORK OR EXERCISE: Has there been any recent work or exercise that involved this part of the body?       5. CAUSE: What Gloria Perkins you think is causing the arm pain?     Carpel tunnel and arthritis 6. OTHER SYMPTOMS: Gloria Perkins you have any other symptoms? (e.g., neck pain, swelling, rash, fever, numbness, weakness)     Tingling in hands  7. PREGNANCY: Is there any chance you are pregnant? When was your last menstrual period?  Protocols used: Arm Pain-A-AH

## 2024-06-25 NOTE — Telephone Encounter (Signed)
 Duplicate message

## 2024-06-25 NOTE — Telephone Encounter (Signed)
 Left message for patient to return call OK to advise

## 2024-06-25 NOTE — Telephone Encounter (Signed)
 Pt calling in multiple times today, requesting oxycodone -acetaminophen  10-325 refill. Pt states that she cannot go without it. Pt was advised that the request has been entered and that refills can take up to 3 business days. Pt states that it was requested on 11/25, RN explained that the clinics were closed 11/26 until today. Pt states he cannot be that busy, to take 2 minutes to send in the pills. Pt then advises that she will come to the clinic to see him herself.  Pt states that she is going to find a new doctor, he knows that I need this medication, I am immune to it, it is not a medication that you can miss, and I have been without it for days.   Copied from CRM #[id number]. Topic: Clinical - Red Word Triage >> Jun 25, 2024 11:26 AM Shanda MATSU wrote: Red Word that prompted transfer to Nurse Triage: Patient is reporting extreme pain due to not having pain meds.

## 2024-06-25 NOTE — Telephone Encounter (Signed)
 Refill sent. And comments / updated note on recent phone call.  I see 3 phone calls today and refill request on 11/26.  [name], DO [facility] 06/25/2024, 12:04 PM

## 2024-06-25 NOTE — Telephone Encounter (Signed)
 Please call patient back to notify her that Rx Refill Oxycodone  was sent to the pharmacy today. [facility]ill.  It looks like the request was sent in on 11/26 day before the holiday and it was not processed until after. So that is why there was a delay.  I understand she is frustrated with us  and I am not sure how to respond to her words. We can leave it documented in the chart as it is.  I would suggest that she should call to speak to our office to notify us  in the future if she is submitting a refill request before a holiday or weekend etc. If it needs to be done that day she should contact the triage nurse to leave us  a message instead of doing the refill request.  But we do our best to handle refills and usually the rule is 3 business days, so she should contact us  for refills further in advance so we can handle the request.  Thank you!  [name], DO [facility] 06/25/2024, 11:59 AM

## 2024-07-02 ENCOUNTER — Other Ambulatory Visit: Payer: Self-pay | Admitting: Family Medicine

## 2024-07-02 ENCOUNTER — Telehealth: Payer: Self-pay | Admitting: Family Medicine

## 2024-07-02 DIAGNOSIS — F5101 Primary insomnia: Secondary | ICD-10-CM

## 2024-07-02 DIAGNOSIS — G5602 Carpal tunnel syndrome, left upper limb: Secondary | ICD-10-CM

## 2024-07-02 DIAGNOSIS — M19041 Primary osteoarthritis, right hand: Secondary | ICD-10-CM

## 2024-07-02 DIAGNOSIS — R109 Unspecified abdominal pain: Secondary | ICD-10-CM

## 2024-07-02 DIAGNOSIS — R197 Diarrhea, unspecified: Secondary | ICD-10-CM

## 2024-07-02 NOTE — Telephone Encounter (Unsigned)
 Copied from CRM [id number]. Topic: Clinical - Medication Refill >> Jul 02, 2024 11:37 AM [name] wrote: Medication: oxyCODONE -acetaminophen  (PERCOCET) 10-325 MG tablet and zolpidem  (AMBIEN ) 10 MG tablet  Has the patient contacted their pharmacy? No  This is the patient's preferred pharmacy:  [id number] [place], [place] - [address] [place] [address] Phone: [phone] Fax: [phone]  Is this the correct pharmacy for this prescription? Yes  Has the prescription been filled recently? Yes  Is the patient out of the medication? No  Has the patient been seen for an appointment in the last year OR does the patient have an upcoming appointment? Yes  Can we respond through MyChart? No  Agent: Please be advised that Rx refills may take up to 3 business days. We ask that you follow-up with your pharmacy.

## 2024-07-03 ENCOUNTER — Other Ambulatory Visit: Payer: Self-pay | Admitting: Family Medicine

## 2024-07-03 ENCOUNTER — Telehealth: Payer: Self-pay

## 2024-07-03 DIAGNOSIS — M19041 Primary osteoarthritis, right hand: Secondary | ICD-10-CM

## 2024-07-03 DIAGNOSIS — G5602 Carpal tunnel syndrome, left upper limb: Secondary | ICD-10-CM

## 2024-07-03 MED ORDER — OXYCODONE-ACETAMINOPHEN 10-325 MG PO TABS: 1.0000 | ORAL_TABLET | ORAL | 0 refills | Status: DC | PRN

## 2024-07-03 NOTE — Telephone Encounter (Signed)
 Copied from CRM #[id number]. Topic: Clinical - Prescription Issue >> Jul 03, 2024  9:44 AM Olam RAMAN wrote: Reason for CRM: pt was calling about oxyCODONE -acetaminophen  (PERCOCET) 10-325 MG tablet Advised 3 b days process since it was called Im 12/8 and to f/u with her pharmacy

## 2024-07-03 NOTE — Telephone Encounter (Signed)
 Called patient. Will send rx now for pick up tomorrow 12/10 it may be due on 10th day or 12/11 but she can talk to pharmacy. She asks about larger quantity for more days of coverage but I am concerned it may not be covered at higher quantity or may require additional PA. She will contact us  3 days in advance in future. She plans to get time off work after January to pursue surgery  [name], DO [facility] 07/03/2024, 11:50 AM

## 2024-07-03 NOTE — Telephone Encounter (Signed)
 Left message for patient to return call.

## 2024-07-03 NOTE — Telephone Encounter (Signed)
 Copied from CRM #[id number]. Topic: General - Call Back - No Documentation >> Jul 03, 2024  9:45 AM Olam RAMAN wrote: Reason for CRM: pt is asking for provider, DR K to call her back no one else if not she will go to office Cb [phone] (M)

## 2024-07-04 NOTE — Telephone Encounter (Signed)
 Requested medication (s) are due for refill today: yes  Requested medication (s) are on the active medication list: yes  Last refill:  04/12/24  Future visit scheduled: yes  Notes to clinic:  Unable to refill per protocol, cannot delegate.      Requested Prescriptions  Pending Prescriptions Disp Refills   oxyCODONE -acetaminophen  (PERCOCET) 10-325 MG tablet 56 tablet 0    Sig: Take 1 tablet by mouth every 4 (four) hours as needed for pain.     Not Delegated - Analgesics:  Opioid Agonist Combinations Failed - 07/04/2024 11:33 AM      Failed - This refill cannot be delegated      Failed - Urine Drug Screen completed in last 360 days      Passed - Valid encounter within last 3 months    Recent Outpatient Visits           2 months ago Carpal tunnel syndrome on left   [facility] [place], Marsa PARAS, DO               zolpidem  (AMBIEN ) 10 MG tablet 30 tablet 2    Sig: Take 1 tablet (10 mg total) by mouth at bedtime as needed. for sleep     Not Delegated - Psychiatry:  Anxiolytics/Hypnotics Failed - 07/04/2024 11:33 AM      Failed - This refill cannot be delegated      Failed - Urine Drug Screen completed in last 360 days      Passed - Valid encounter within last 6 months    Recent Outpatient Visits           2 months ago Carpal tunnel syndrome on left   [facility] [place], Marsa PARAS, [place]

## 2024-07-04 NOTE — Telephone Encounter (Signed)
 Too soon for refill.  Requested Prescriptions  Pending Prescriptions Disp Refills   dicyclomine  (BENTYL ) 10 MG capsule [Pharmacy Med Name: DICYCLOMINE  10 MG CAPSULE] 360 capsule 1    Sig: TAKE 1 CAPSULE (10 MG TOTAL) BY MOUTH 4 (FOUR) TIMES DAILY - BEFORE MEALS AND AT BEDTIME. AS NEEDED FOR DIARRHEA, ABDOMINAL CRAMPING     Gastroenterology:  Antispasmodic Agents Passed - 07/04/2024 12:08 PM      Passed - Valid encounter within last 12 months    Recent Outpatient Visits           2 months ago Carpal tunnel syndrome on left   [facility] [place], Marsa PARAS, [place]

## 2024-07-04 NOTE — Telephone Encounter (Signed)
 Spoke to patient, [facility].

## 2024-07-04 NOTE — Telephone Encounter (Signed)
 Can you call patient to check status? I talked to her directly yesterday 12/9 and sent rx for oxycodone  to fill today 12/10.  Can you confirm that she was able to pick it up or will be able to pick it up? I will deny this duplicate request.  [name], DO [facility] 07/04/2024, 1:23 PM

## 2024-07-16 ENCOUNTER — Other Ambulatory Visit: Payer: Self-pay | Admitting: Family Medicine

## 2024-07-16 DIAGNOSIS — G5602 Carpal tunnel syndrome, left upper limb: Secondary | ICD-10-CM

## 2024-07-16 DIAGNOSIS — M19041 Primary osteoarthritis, right hand: Secondary | ICD-10-CM

## 2024-07-16 NOTE — Telephone Encounter (Signed)
 Copied from CRM #[id number]. Topic: Clinical - Medication Refill >> Jul 16, 2024  8:57 AM Darshell M wrote: Medication:  oxyCODONE -acetaminophen  (PERCOCET) 10-325 MG tablet    Has the patient contacted their pharmacy? No (Agent: If no, request that the patient contact the pharmacy for the refill. If patient does not wish to contact the pharmacy document the reason why and proceed with request.) (Agent: If yes, when and what did the pharmacy advise?)  This is the patient's preferred pharmacy:  [id number] [place], [place] - [address] [place] [address] Phone: [phone] Fax: [phone]  Is this the correct pharmacy for this prescription? Yes If no, delete pharmacy and type the correct one.   Has the prescription been filled recently? No  Is the patient out of the medication? Yes  Has the patient been seen for an appointment in the last year OR does the patient have an upcoming appointment? Yes  Can we respond through MyChart? No  Agent: Please be advised that Rx refills may take up to 3 business days. We ask that you follow-up with your pharmacy.

## 2024-07-17 ENCOUNTER — Other Ambulatory Visit: Payer: Self-pay | Admitting: Family Medicine

## 2024-07-17 ENCOUNTER — Ambulatory Visit: Payer: Self-pay | Admitting: Family Medicine

## 2024-07-17 DIAGNOSIS — M19041 Primary osteoarthritis, right hand: Secondary | ICD-10-CM

## 2024-07-17 DIAGNOSIS — G5602 Carpal tunnel syndrome, left upper limb: Secondary | ICD-10-CM

## 2024-07-17 NOTE — Telephone Encounter (Signed)
 Copied from CRM #[id number]. Topic: Clinical - Prescription Issue >> Jul 17, 2024 11:01 AM Wess RAMAN wrote: Reason for CRM: Patient is out of oxyCODONE -acetaminophen  (PERCOCET) 10-325 MG tablet  Callback #: [id number]  [facility]acy[id number] - [place], [place] - [address] [place] [address] Phone: [phone] Fax: [phone] Hours: Not open 24 hours

## 2024-07-17 NOTE — Telephone Encounter (Signed)
 FYI Only or Action Required?: Action required by provider: medication refill request.  Patient was last seen in primary care on 04/12/2024 by Edman Marsa PARAS, DO.  Called Nurse Triage reporting Dizziness.  Symptoms began yesterday.  Interventions attempted: Nothing.  Symptoms are: unchanged.  Triage Disposition: Go to ED Now (or PCP Triage)  Patient/caregiver understands and will follow disposition?: No, refuses disposition   Attempted to call CAL, no answer   Copied from CRM #[id number]. Topic: Clinical - Red Word Triage >> Jul 17, 2024  2:39 PM Hadassah PARAS wrote: Red Word that prompted transfer to Nurse Triage: Pt is calling to fu on oxyCODONE -acetaminophen  (PERCOCET) 10-325 MG tablet . Stating she is in extreme pain, Lightheaded, dizziness, nausea, and diarrhea. Transferred to NT Reason for Disposition  SEVERE dizziness (e.g., unable to stand, requires support to walk, feels like passing out now)  Answer Assessment - Initial Assessment Questions Pt takes 4 pain pills a day. She states last month she went without for 4 days and felt horrible and doesn't want to feel that way again. She states this time she tried to call it in early (Friday) because it was due over a weekend. It still has not been filled and she feels horrible. States she only had 2 left and tried to stretch them over the last couple of days. Last dose was yesterday morning. RN asked what symptoms she is having, weakness, nausea, diarrhea, lightheaded, sweating, hot flashes, shortness of breath (but states she has COPD). When asked if the lightheadedness is when she stands, she stated she can barely stand she is so weak. RN advised for patient to go to the ER due to likely withdrawal symptoms. She stated she will not be going to the ER. She just needs her medications and she's trying not to be a bother and call too early for her medications. Rn stated understanding.  No further questions asked due to recommendations  being to go to ER.     1. DESCRIPTION: Describe your dizziness.      2. LIGHTHEADED: Do you feel lightheaded? (e.g., somewhat faint, woozy, weak upon standing)      3. VERTIGO: Do you feel like either you or the room is spinning or tilting? (i.e., vertigo)      4. SEVERITY: How bad is it?  Do you feel like you are going to faint? Can you stand and walk?     barely 5. ONSET:  When did the dizziness begin?      6. AGGRAVATING FACTORS: Does anything make it worse? (e.g., standing, change in head position)      7. HEART RATE: Can you tell me your heart rate? How many beats in 15 seconds?  (Note: Not all patients can do this.)        8. CAUSE: What do you think is causing the dizziness? (e.g., decreased fluids or food, diarrhea, emotional distress, heat exposure, new medicine, sudden standing, vomiting; unknown)      9. RECURRENT SYMPTOM: Have you had dizziness before? If Yes, ask: When was the last time? What happened that time?      10. OTHER SYMPTOMS: Do you have any other symptoms? (e.g., fever, chest pain, vomiting, diarrhea, bleeding)        11. PREGNANCY: Is there any chance you are pregnant? When was your last menstrual period?  Protocols used: Dizziness - Lightheadedness-A-AH

## 2024-07-17 NOTE — Telephone Encounter (Signed)
 Patient calling for her medication. Patient states she requested refill on 07/13/24. Patient very upset as she was wanting to get  refill before holidays and office closure.

## 2024-07-18 NOTE — Telephone Encounter (Signed)
 Requested medication (s) are due for refill today- provider review   Requested medication (s) are on the active medication list -yes  Future visit scheduled -no  Last refill: 07/04/24 #56  Notes to clinic: non delegated Rx  Requested Prescriptions  Pending Prescriptions Disp Refills   oxyCODONE -acetaminophen  (PERCOCET) 10-325 MG tablet 56 tablet 0    Sig: Take 1 tablet by mouth every 4 (four) hours as needed for pain.     Not Delegated - Analgesics:  Opioid Agonist Combinations Failed - 07/18/2024 11:42 AM      Failed - This refill cannot be delegated      Failed - Urine Drug Screen completed in last 360 days      Failed - Valid encounter within last 3 months    Recent Outpatient Visits           3 months ago Carpal tunnel syndrome on left   [facility] [place], Marsa PARAS, DO                 Requested Prescriptions  Pending Prescriptions Disp Refills   oxyCODONE -acetaminophen  (PERCOCET) 10-325 MG tablet 56 tablet 0    Sig: Take 1 tablet by mouth every 4 (four) hours as needed for pain.     Not Delegated - Analgesics:  Opioid Agonist Combinations Failed - 07/18/2024 11:42 AM      Failed - This refill cannot be delegated      Failed - Urine Drug Screen completed in last 360 days      Failed - Valid encounter within last 3 months    Recent Outpatient Visits           3 months ago Carpal tunnel syndrome on left   [facility] [place], Marsa PARAS, [place]

## 2024-07-18 NOTE — Telephone Encounter (Signed)
 Requested medication (s) are due for refill today: Yes  Requested medication (s) are on the active medication list: Yes  Last refill:  07/03/24  Future visit scheduled: No  Notes to clinic:  Unable to refill per protocol, cannot delegate.      Requested Prescriptions  Pending Prescriptions Disp Refills   oxyCODONE -acetaminophen  (PERCOCET) 10-325 MG tablet 56 tablet 0    Sig: Take 1 tablet by mouth every 4 (four) hours as needed for pain.     Not Delegated - Analgesics:  Opioid Agonist Combinations Failed - 07/18/2024 12:58 PM      Failed - This refill cannot be delegated      Failed - Urine Drug Screen completed in last 360 days      Failed - Valid encounter within last 3 months    Recent Outpatient Visits           3 months ago Carpal tunnel syndrome on left   [facility] [place], Marsa PARAS, [place]

## 2024-07-20 ENCOUNTER — Telehealth: Payer: Self-pay | Admitting: Family Medicine

## 2024-07-20 MED ORDER — OXYCODONE-ACETAMINOPHEN 10-325 MG PO TABS: 1.0000 | ORAL_TABLET | ORAL | 0 refills | Status: DC | PRN

## 2024-07-20 NOTE — Telephone Encounter (Signed)
 Copied from CRM #[id number]. Topic: Clinical - Refused Triage >> Jul 20, 2024 11:10 AM Tiffini S wrote:  Patient called stating that she is in a lot pain, can't walk and have sent messages to pcp for medication refill and physically went to office  oxyCODONE -acetaminophen  (PERCOCET) 10-325 MG tablet Requested medication refill on 07/13/24 Have spoken with two TN for extreme pain, lightheaded, dizziness, nausea, and diarrhea- refused TN  States that she may have to go to the hospital today if medication is not filled and picked up today- side effects from not having medication  Explained:  oxyCODONE -acetaminophen  (PERCOCET) 10-325 MG tablet   Sent to pharmacy as: oxyCODONE -acetaminophen  (PERCOCET) 10-325 MG tablet  Earliest Fill Date: 07/20/2024  E-Prescribing Status: Receipt confirmed by pharmacy (07/20/2024 10:41 AM EST)   Called CAL, spoke with Vernell- please call to follow up with patient at [phone] to follow up

## 2024-07-20 NOTE — Telephone Encounter (Signed)
 Left message for patient to return call OK to advise

## 2024-07-20 NOTE — Telephone Encounter (Signed)
 Please let her know that I have sent her rx already this morning for Oxycodone . It can be picked up today. It seems that she submitted the original request on on 12/22 and the refill team routed it back to us  on 12/24, and we were out of office.  I have sent the refill now. Unfortunately it seems the timing has not gone well for her refills due to holidays and weekends. I am not sure how else to solve the issue we do try to manage all refills within 72 hours and cannot guarantee same day refills. We can keep trying otherwise, my other suggestion would be pain management referral.  ED is not the optimal result but it is possible in emergency situation if entirely out of medication and it is a weekend or holiday.  [name], DO [facility] 07/20/2024, 1:40 PM

## 2024-07-27 ENCOUNTER — Telehealth: Payer: Self-pay | Admitting: Family Medicine

## 2024-07-27 DIAGNOSIS — G5602 Carpal tunnel syndrome, left upper limb: Secondary | ICD-10-CM

## 2024-07-27 DIAGNOSIS — M19041 Primary osteoarthritis, right hand: Secondary | ICD-10-CM

## 2024-07-27 NOTE — Telephone Encounter (Signed)
 Copied from CRM [id number]. Topic: Clinical - Medication Refill >> Jul 27, 2024 10:09 AM Maisie C wrote: Medication: oxycodone    Has the patient contacted their pharmacy? Yes (Agent: If no, request that the patient contact the pharmacy for the refill. If patient does not wish to contact the pharmacy document the reason why and proceed with request.) (Agent: If yes, when and what did the pharmacy advise?)  This is the patient's preferred pharmacy:  [id number] [place], [place] - [address] [place] [address] Phone: [phone] Fax: [phone]   Is this the correct pharmacy for this prescription? Yes If no, delete pharmacy and type the correct one.   Has the prescription been filled recently? Yes  Is the patient out of the medication? No  Has the patient been seen for an appointment in the last year OR does the patient have an upcoming appointment? No  Can we respond through MyChart? No  Agent: Please be advised that Rx refills may take up to 3 business days. We ask that you follow-up with your pharmacy.

## 2024-07-30 MED ORDER — OXYCODONE-ACETAMINOPHEN 10-325 MG PO TABS: 1.0000 | ORAL_TABLET | ORAL | 0 refills | Status: DC | PRN

## 2024-07-30 NOTE — Telephone Encounter (Signed)
 Could you call patient to notify her that the Oxycodone  pain medicine refill was sent and should be able to be picked up today. Thank you!  [name], DO [facility] 07/30/2024, 8:37 AM

## 2024-07-30 NOTE — Telephone Encounter (Signed)
 Left message for patient to return call OK to advise  refill has been sent in

## 2024-08-01 ENCOUNTER — Other Ambulatory Visit: Payer: Self-pay | Admitting: Family Medicine

## 2024-08-01 DIAGNOSIS — F5101 Primary insomnia: Secondary | ICD-10-CM

## 2024-08-02 NOTE — Telephone Encounter (Signed)
 Requested medication (s) are due for refill today - yes  Requested medication (s) are on the active medication list -yes  Future visit scheduled -no  Last refill: 04/12/24 #30 2RF  Notes to clinic: non delegated Rx  Requested Prescriptions  Pending Prescriptions Disp Refills   zolpidem  (AMBIEN ) 10 MG tablet [Pharmacy Med Name: ZOLPIDEM  TARTRATE 10 MG TABLET] 30 tablet 2    Sig: Take 1 tablet (10 mg total) by mouth at bedtime as needed. for sleep     Not Delegated - Psychiatry:  Anxiolytics/Hypnotics Failed - 08/02/2024  2:33 PM      Failed - This refill cannot be delegated      Failed - Urine Drug Screen completed in last 360 days      Passed - Valid encounter within last 6 months    Recent Outpatient Visits           3 months ago Carpal tunnel syndrome on left   [facility] [place], Marsa PARAS, DO                 Requested Prescriptions  Pending Prescriptions Disp Refills   zolpidem  (AMBIEN ) 10 MG tablet [Pharmacy Med Name: ZOLPIDEM  TARTRATE 10 MG TABLET] 30 tablet 2    Sig: Take 1 tablet (10 mg total) by mouth at bedtime as needed. for sleep     Not Delegated - Psychiatry:  Anxiolytics/Hypnotics Failed - 08/02/2024  2:33 PM      Failed - This refill cannot be delegated      Failed - Urine Drug Screen completed in last 360 days      Passed - Valid encounter within last 6 months    Recent Outpatient Visits           3 months ago Carpal tunnel syndrome on left   [facility] [place], Marsa PARAS, [place]

## 2024-08-07 ENCOUNTER — Other Ambulatory Visit: Payer: Self-pay | Admitting: Family Medicine

## 2024-08-07 DIAGNOSIS — M19041 Primary osteoarthritis, right hand: Secondary | ICD-10-CM

## 2024-08-07 DIAGNOSIS — G5602 Carpal tunnel syndrome, left upper limb: Secondary | ICD-10-CM

## 2024-08-07 MED ORDER — OXYCODONE-ACETAMINOPHEN 10-325 MG PO TABS: 1.0000 | ORAL_TABLET | ORAL | 0 refills | Status: DC | PRN

## 2024-08-07 NOTE — Telephone Encounter (Signed)
 Copied from CRM [id number]. Topic: Clinical - Medication Refill >> Aug 07, 2024  1:21 PM Yolanda T wrote: Medication: oxyCODONE -acetaminophen  (PERCOCET) 10-325 MG tablet  Has the patient contacted their pharmacy? No  This is the patient's preferred pharmacy:  [id number] [place], [place] - [address] [place] [address] Phone: [phone] Fax: [phone]  Is this the correct pharmacy for this prescription? Yes  Has the prescription been filled recently? Yes  Is the patient out of the medication? No  Has the patient been seen for an appointment in the last year OR does the patient have an upcoming appointment? Yes  Can we respond through MyChart? No  Agent: Please be advised that Rx refills may take up to 3 business days. We ask that you follow-up with your pharmacy.

## 2024-08-09 ENCOUNTER — Telehealth: Payer: Self-pay

## 2024-08-09 DIAGNOSIS — M19042 Primary osteoarthritis, left hand: Secondary | ICD-10-CM

## 2024-08-09 DIAGNOSIS — G5602 Carpal tunnel syndrome, left upper limb: Secondary | ICD-10-CM

## 2024-08-09 MED ORDER — OXYCODONE-ACETAMINOPHEN 10-325 MG PO TABS: 1.0000 | ORAL_TABLET | ORAL | 0 refills | Status: DC | PRN

## 2024-08-09 NOTE — Telephone Encounter (Signed)
 [facility]  6 day supply was an error on their part. My order was actually correct they told me. It was corrected and she did not have to pay extra.  I have sent a new rx for 08/19/24 for 60 pills max of 6 per day for 10 day supply.  [name], DO [facility] 08/09/2024, 5:15 PM

## 2024-08-09 NOTE — Telephone Encounter (Signed)
 Understood.  The fill date on the Oxycodone  Pain Medication was set for today 08/09/24. If she tried to pick it up prior to today it would trigger the pharmacy to say it is too early. Today is 10 days from the last fill 07/30/24  Her letter for work note for today 1/15 has been written and is on her MyChart.  If she needs a paper printed signed copy we can do that tomorrow on Friday 1/16 when I am back in office.  [name], DO [facility] 08/09/2024, 3:46 PM

## 2024-08-09 NOTE — Telephone Encounter (Signed)
 Patient came by the office, gave her the work note. She stated she would try going to work tomorrow.   However she did say her prescription needs to be for 10 days instead of 6. She had to pay extra today for her prescription.

## 2024-08-09 NOTE — Addendum Note (Signed)
 Addended by: EDMAN MARSA PARAS on: 08/09/2024 05:16 PM   Modules accepted: Orders

## 2024-08-09 NOTE — Telephone Encounter (Signed)
 Copied from CRM [id number]. Topic: Clinical - Prescription Issue >> Aug 09, 2024  2:55 PM Delon DASEN wrote: Reason for CRM: oxyCODONE -acetaminophen  (PERCOCET) 10-325 MG tablet- getting message it is too soon

## 2024-08-09 NOTE — Telephone Encounter (Signed)
 Copied from CRM [id number]. Topic: General - Other >> Aug 09, 2024  2:56 PM Delon DASEN wrote: Reason for CRM: Patient needs a work note for today and tomorrow unless she can get her medication refill.  [phone]

## 2024-08-09 NOTE — Telephone Encounter (Signed)
 See previous message

## 2024-08-09 NOTE — Telephone Encounter (Signed)
 Copied from CRM #[id number]. Topic: Clinical - Prescription Issue >> Aug 09, 2024  3:45 PM Mia F wrote: Reason for CRM: Pt pharmacy says that rx oxyCODONE -acetaminophen  (PERCOCET) 10-325 MG tablet  is being rejected because it was written for 6 days but it need to be written 10 days in order for it to be covered by the insurance.

## 2024-08-11 ENCOUNTER — Encounter: Payer: Self-pay | Admitting: Emergency Medicine

## 2024-08-11 ENCOUNTER — Other Ambulatory Visit: Payer: Self-pay

## 2024-08-11 ENCOUNTER — Emergency Department
Admission: EM | Admit: 2024-08-11 | Discharge: 2024-08-11 | Disposition: A | Discharge status: Home / Self Care | Attending: Emergency Medicine | Admitting: Emergency Medicine

## 2024-08-11 ENCOUNTER — Emergency Department

## 2024-08-11 DIAGNOSIS — I10 Essential (primary) hypertension: Secondary | ICD-10-CM | POA: Diagnosis not present

## 2024-08-11 DIAGNOSIS — R109 Unspecified abdominal pain: Secondary | ICD-10-CM

## 2024-08-11 DIAGNOSIS — R197 Diarrhea, unspecified: Secondary | ICD-10-CM

## 2024-08-11 DIAGNOSIS — R1032 Left lower quadrant pain: Secondary | ICD-10-CM | POA: Diagnosis present

## 2024-08-11 LAB — URINALYSIS, ROUTINE W REFLEX MICROSCOPIC
Bilirubin Urine: NEGATIVE
Glucose, UA: NEGATIVE mg/dL
Hgb urine dipstick: NEGATIVE
Ketones, ur: NEGATIVE mg/dL
Leukocytes,Ua: NEGATIVE
Nitrite: NEGATIVE
Protein, ur: NEGATIVE mg/dL
Specific Gravity, Urine: >1.046 — ABNORMAL HIGH (ref 1.005–1.030)
pH: 6 (ref 5.0–8.0)

## 2024-08-11 LAB — COMPREHENSIVE METABOLIC PANEL WITH GFR
ALT: 19 U/L (ref 0–44)
AST: 23 U/L (ref 15–41)
Albumin: 4.3 g/dL (ref 3.5–5.0)
Alkaline Phosphatase: 106 U/L (ref 38–126)
Anion gap: 9 (ref 5–15)
BUN: 13 mg/dL (ref 6–20)
CO2: 29 mmol/L (ref 22–32)
Calcium: 9.4 mg/dL (ref 8.9–10.3)
Chloride: 104 mmol/L (ref 98–111)
Creatinine, Ser: 0.69 mg/dL (ref 0.44–1.00)
GFR, Estimated: >60 mL/min
Glucose, Bld: 113 mg/dL — ABNORMAL HIGH (ref 70–99)
Potassium: 4.2 mmol/L (ref 3.5–5.1)
Sodium: 141 mmol/L (ref 135–145)
Total Bilirubin: 0.3 mg/dL (ref 0.0–1.2)
Total Protein: 7.1 g/dL (ref 6.5–8.1)

## 2024-08-11 LAB — CBC
HCT: 37.3 % (ref 36.0–46.0)
Hemoglobin: 12.6 g/dL (ref 12.0–15.0)
MCH: 29 pg (ref 26.0–34.0)
MCHC: 33.8 g/dL (ref 30.0–36.0)
MCV: 85.9 fL (ref 80.0–100.0)
Platelets: 170 K/uL (ref 150–400)
RBC: 4.34 MIL/uL (ref 3.87–5.11)
RDW: 13.1 % (ref 11.5–15.5)
WBC: 7.5 K/uL (ref 4.0–10.5)
nRBC: 0 % (ref 0.0–0.2)

## 2024-08-11 LAB — LIPASE, BLOOD: Lipase: 37 U/L (ref 11–51)

## 2024-08-11 MED ORDER — MORPHINE SULFATE (PF) 4 MG/ML IV SOLN
4.0000 mg | Freq: Once | INTRAVENOUS | Status: AC
  Administered 2024-08-11: 4 mg via INTRAVENOUS
  Filled 2024-08-11: qty 1

## 2024-08-11 MED ORDER — ONDANSETRON HCL 4 MG/2ML IJ SOLN
4.0000 mg | Freq: Once | INTRAMUSCULAR | Status: AC
  Administered 2024-08-11: 4 mg via INTRAVENOUS
  Filled 2024-08-11: qty 2

## 2024-08-11 MED ORDER — MORPHINE SULFATE (PF) 4 MG/ML IV SOLN
4.0000 mg | Freq: Once | INTRAVENOUS | Status: DC
  Filled 2024-08-11: qty 1

## 2024-08-11 MED ORDER — PANTOPRAZOLE SODIUM 40 MG IV SOLR
40.0000 mg | Freq: Once | INTRAVENOUS | Status: AC
  Administered 2024-08-11: 40 mg via INTRAVENOUS
  Filled 2024-08-11: qty 10

## 2024-08-11 MED ORDER — POLYETHYLENE GLYCOL 3350 17 G PO PACK: 17.0000 g | PACK | Freq: Every day | ORAL | 0 refills | Status: AC

## 2024-08-11 MED ORDER — DICYCLOMINE HCL 10 MG PO CAPS: 10.0000 mg | ORAL_CAPSULE | Freq: Three times a day (TID) | ORAL | 0 refills | Status: AC

## 2024-08-11 MED ORDER — DICYCLOMINE HCL 10 MG PO CAPS
10.0000 mg | ORAL_CAPSULE | Freq: Once | ORAL | Status: AC
  Administered 2024-08-11: 10 mg via ORAL
  Filled 2024-08-11: qty 1

## 2024-08-11 MED ORDER — MORPHINE SULFATE (PF) 4 MG/ML IV SOLN: 4.0000 mg | Freq: Once | INTRAVENOUS | Status: DC

## 2024-08-11 MED ORDER — FENTANYL CITRATE (PF) 50 MCG/ML IJ SOSY
50.0000 ug | PREFILLED_SYRINGE | Freq: Once | INTRAMUSCULAR | Status: AC
  Administered 2024-08-11: 50 ug via INTRAVENOUS
  Filled 2024-08-11: qty 1

## 2024-08-11 MED ORDER — IOHEXOL 300 MG/ML  SOLN
100.0000 mL | Freq: Once | INTRAMUSCULAR | Status: AC | PRN
  Administered 2024-08-11: 100 mL via INTRAVENOUS

## 2024-08-11 NOTE — ED Triage Notes (Signed)
 Pt to ED via POV. PT states that she has been having pain in her LLQ for the past 2 days. Pt states that she thought it was gas but the pain has not went away and she has not been able to have a good BM in 2 days. Pt denies any other symptoms at this time.

## 2024-08-11 NOTE — ED Provider Notes (Signed)
 "  [facility] Provider Note    Event Date/Time   First MD Initiated Contact with Patient 08/11/24 1109     (approximate)   History   Abdominal Pain   HPI  Gloria Perkins is a 59 y.o. female with history of hypertension presents with complaints of left lower quadrant abdominal pain which has been present for 2 to 3 days.  She reports the pain is moderate to severe.  She does report abdominal bloating as well.  She denies regular alcohol usage     Physical Exam   Triage Vital Signs: ED Triage Vitals [08/11/24 1057]  Encounter Vitals Group     BP (!) 132/107     Girls Systolic BP Percentile      Girls Diastolic BP Percentile      Boys Systolic BP Percentile      Boys Diastolic BP Percentile      Pulse Rate 92     Resp 16     Temp 98.7 F (37.1 C)     Temp Source Oral     SpO2 96 %     Weight 73.5 kg (162 lb)     Height 1.575 m (5' 2)     Head Circumference      Peak Flow      Pain Score 10     Pain Loc      Pain Education      Exclude from Growth Chart     Most recent vital signs: Vitals:   08/11/24 1057  BP: (!) 132/107  Pulse: 92  Resp: 16  Temp: 98.7 F (37.1 C)  SpO2: 96%     General: Awake, no distress.  CV:  Good peripheral perfusion.  Resp:  Normal effort.  Abd:  Significant distention, tenderness left lower quadrant primarily Other:     ED Results / Procedures / Treatments   Labs (all labs ordered are listed, but only abnormal results are displayed) Labs Reviewed  COMPREHENSIVE METABOLIC PANEL WITH GFR - Abnormal; Notable for the following components:      Result Value   Glucose, Bld 113 (*)    All other components within normal limits  URINALYSIS, ROUTINE W REFLEX MICROSCOPIC - Abnormal; Notable for the following components:   Color, Urine YELLOW (*)    APPearance CLEAR (*)    Specific Gravity, Urine >1.046 (*)    All other components within normal limits  CBC  LIPASE, BLOOD     EKG     RADIOLOGY CT  scan viewed interpret by me, no acute abnormality, confirmed by radiology    PROCEDURES:  Critical Care performed:   Procedures   MEDICATIONS ORDERED IN ED: Medications  morphine  (PF) 4 MG/ML injection 4 mg (4 mg Intravenous Given 08/11/24 1117)  ondansetron  (ZOFRAN ) injection 4 mg (4 mg Intravenous Given 08/11/24 1116)  iohexol  (OMNIPAQUE ) 300 MG/ML solution 100 mL (100 mLs Intravenous Contrast Given 08/11/24 1207)  pantoprazole  (PROTONIX ) injection 40 mg (40 mg Intravenous Given 08/11/24 1410)  dicyclomine  (BENTYL ) capsule 10 mg (10 mg Oral Given 08/11/24 1410)  fentaNYL  (SUBLIMAZE ) injection 50 mcg (50 mcg Intravenous Given 08/11/24 1418)     IMPRESSION / MDM / ASSESSMENT AND PLAN / ED COURSE  I reviewed the triage vital signs and the nursing notes. Patient's presentation is most consistent with acute presentation with potential threat to life or bodily function.  Patient presents with abdominal distention, left lower quadrant abdominal pain and tenderness.  Will treat with IV morphine , IV  Zofran .  Differential includes diverticular abscess, diverticulitis, small bowel obstruction  Pending labs, will send for CT abdomen pelvis.  Lab work is reassuring, CT abdomen pelvis without acute abnormality  Patient continues to complain of significant pain primarily in the left abdomen, not in the pelvis, she has a concern that it could be related to trichomonas I have assured her this is unlikely to be the case.  Will give another dose of analgesics.  Patient repeatedly stating that she wants me to test for food poisoning and/or trichomonas, I have repeated that neither of those fit her symptoms, she is frustrated by this and has told me that she wants to go to another hospital.  Discussed with her possible admission here but she does not want to stay in the hospital overnight.  Given this we will prescribe Bentyl , MiraLAX , outpatient follow-up recommended, return precautions discussed        FINAL CLINICAL IMPRESSION(S) / ED DIAGNOSES   Final diagnoses:  Left lower quadrant abdominal pain     Rx / DC Orders   ED Discharge Orders     None        Note:  This document was prepared using Dragon voice recognition software and may include unintentional dictation errors.   Arlander Charleston, MD 08/11/24 1453  "

## 2024-08-15 MED ORDER — OXYCODONE-ACETAMINOPHEN 10-325 MG PO TABS: 1.0000 | ORAL_TABLET | ORAL | 0 refills | Status: DC | PRN

## 2024-08-15 NOTE — Telephone Encounter (Signed)
 Left message for patient to return call OK to advise

## 2024-08-15 NOTE — Addendum Note (Signed)
 Addended by: EDMAN MARSA PARAS on: 08/15/2024 12:57 PM   Modules accepted: Orders

## 2024-08-15 NOTE — Telephone Encounter (Signed)
 Please call Gloria Perkins and let her know that the next Oxycodone  order was originally set for Sunday 1/25 which was 10 days after last order.  However, due to winter weather, I went ahead and updated it to be set for Friday 1/23. I do not know if the pharmacy can approve this. She may have to pay another portion out of pocket again to get it early. She will need to check with pharmacy on Friday to see if she is eligible to get the med early now that I sent it in.  [name], DO [facility] 08/15/2024, 12:57 PM

## 2024-08-17 ENCOUNTER — Telehealth: Payer: Self-pay | Admitting: Family Medicine

## 2024-08-17 DIAGNOSIS — M19041 Primary osteoarthritis, right hand: Secondary | ICD-10-CM

## 2024-08-17 DIAGNOSIS — G5602 Carpal tunnel syndrome, left upper limb: Secondary | ICD-10-CM

## 2024-08-17 NOTE — Telephone Encounter (Signed)
 Can you call her again to notify her or confirm that she is aware the rx for Oxycodone  was already sent and should be ready for today Friday 08/17/24. She needs to contact pharmacy to get it picked up.  I received a duplicate request for her pain med that I cancelled because it is already ordered.  [name], DO [facility] 08/17/2024, 1:31 PM

## 2024-08-17 NOTE — Telephone Encounter (Signed)
 Requested medication (s) are due for refill today: no  Requested medication (s) are on the active medication list: yes  Last refill:  08/17/24  Future visit scheduled: no  Notes to clinic:  NT cannot refused this non delegated med- reordered today   Requested Prescriptions  Pending Prescriptions Disp Refills   oxyCODONE -acetaminophen  (PERCOCET) 10-325 MG tablet 60 tablet 0    Sig: Take 1 tablet by mouth every 4 (four) hours as needed for pain. Max of 6 tablets in 24 hours.     Not Delegated - Analgesics:  Opioid Agonist Combinations Failed - 08/17/2024 12:58 PM      Failed - This refill cannot be delegated      Failed - Urine Drug Screen completed in last 360 days      Failed - Valid encounter within last 3 months    Recent Outpatient Visits           4 months ago Carpal tunnel syndrome on left   [facility] [place], Marsa PARAS, [place]

## 2024-08-17 NOTE — Telephone Encounter (Signed)
 Copied from CRM #[id number]. Topic: Clinical - Medication Refill >> Aug 17, 2024  9:46 AM Hadassah PARAS wrote: Medication: oxyCODONE -acetaminophen  (PERCOCET) 10-325 MG tablet ( prev time she was given 6 day supply instead of 10, please give 10 day supply)   Has the patient contacted their pharmacy? No (Agent: If no, request that the patient contact the pharmacy for the refill. If patient does not wish to contact the pharmacy document the reason why and proceed with request.) (Agent: If yes, when and what did the pharmacy advise?)  This is the patient's preferred pharmacy:  [id number] - [place], [place] - [address] [place] [address] Phone: [phone] Fax: [phone]    Is this the correct pharmacy for this prescription? Yes If no, delete pharmacy and type the correct one.   Has the prescription been filled recently? Yes  Is the patient out of the medication? Yes  Has the patient been seen for an appointment in the last year OR does the patient have an upcoming appointment? Yes  Can we respond through MyChart? No  Agent: Please be advised that Rx refills may take up to 3 business days. We ask that you follow-up with your pharmacy.

## 2024-08-21 ENCOUNTER — Telehealth: Payer: Self-pay | Admitting: Family Medicine

## 2024-08-21 NOTE — Telephone Encounter (Unsigned)
 Copied from CRM #[id number]. Topic: Clinical - Prescription Issue >> Aug 21, 2024 11:38 AM Tiffini S wrote: Reason for CRM: Patient asked for an update on the medication refill from 08/17/24  Advised:   oxyCODONE -acetaminophen  (PERCOCET) 10-325 MG tablet  Sent to pharmacy as: oxyCODONE -acetaminophen  (PERCOCET) 10-325 MG tablet  Earliest Fill Date: 08/17/2024  Notes to Pharmacy: Requesting possible early fill prior to winter weather 08/17/2024  E-Prescribing Status: Receipt confirmed by pharmacy (08/15/2024 12:56 PM EST)   Patient asked for a call back to discuss why the refill was changed from 10 tablets to 6 tablets- please call [phone]

## 2024-08-21 NOTE — Telephone Encounter (Signed)
 Spoke with patient, she will contact pharmacy to get her refill.

## 2024-08-28 ENCOUNTER — Other Ambulatory Visit: Payer: Self-pay | Admitting: Family Medicine

## 2024-08-28 DIAGNOSIS — F5101 Primary insomnia: Secondary | ICD-10-CM

## 2024-08-28 DIAGNOSIS — M19042 Primary osteoarthritis, left hand: Secondary | ICD-10-CM

## 2024-08-28 DIAGNOSIS — G5602 Carpal tunnel syndrome, left upper limb: Secondary | ICD-10-CM

## 2024-08-28 NOTE — Telephone Encounter (Unsigned)
 Copied from CRM #[id number]. Topic: Clinical - Medication Refill >> Aug 28, 2024  9:56 AM Donna BRAVO wrote: Medication:  oxyCODONE -acetaminophen  (PERCOCET) 10-325 MG tablet  zolpidem  (AMBIEN ) 10 MG tablet   Has the patient contacted their pharmacy? No  (Agent: If no, request that the patient contact the pharmacy for the refill. If patient does not wish to contact the pharmacy document the reason why and proceed with request.) (Agent: If yes, when and what did the pharmacy advise?)  This is the patient's preferred pharmacy:   [id number] - [place], [place] - [address] [place] [address] Phone: [phone] Fax: [phone]   Is this the correct pharmacy for this prescription? Yes If no, delete pharmacy and type the correct one.   Has the prescription been filled recently? Yes  Is the patient out of the medication? No  Has the patient been seen for an appointment in the last year OR does the patient have an upcoming appointment? Yes  Can we respond through MyChart? No  Agent: Please be advised that Rx refills may take up to 3 business days. We ask that you follow-up with your pharmacy.

## 2024-08-29 MED ORDER — ZOLPIDEM TARTRATE 10 MG PO TABS: 10.0000 mg | ORAL_TABLET | Freq: Every evening | ORAL | 2 refills | Status: AC | PRN

## 2024-08-29 MED ORDER — OXYCODONE-ACETAMINOPHEN 10-325 MG PO TABS: 1.0000 | ORAL_TABLET | ORAL | 0 refills | Status: AC | PRN

## 2024-08-29 NOTE — Telephone Encounter (Signed)
 Requested medication (s) are due for refill today: yes  Requested medication (s) are on the active medication list: yes  Last refill:  08/15/24,08/02/24  Future visit scheduled: no  Notes to clinic:  Unable to refill per protocol, cannot delegate.      Requested Prescriptions  Pending Prescriptions Disp Refills   oxyCODONE -acetaminophen  (PERCOCET) 10-325 MG tablet 60 tablet 0    Sig: Take 1 tablet by mouth every 4 (four) hours as needed for pain. Max of 6 tablets in 24 hours.     Not Delegated - Analgesics:  Opioid Agonist Combinations Failed - 08/29/2024  3:46 PM      Failed - This refill cannot be delegated      Failed - Urine Drug Screen completed in last 360 days      Failed - Valid encounter within last 3 months    Recent Outpatient Visits           4 months ago Carpal tunnel syndrome on left   [facility] [place], Marsa PARAS, DO               zolpidem  (AMBIEN ) 10 MG tablet 30 tablet 2    Sig: Take 1 tablet (10 mg total) by mouth at bedtime as needed. for sleep     Not Delegated - Psychiatry:  Anxiolytics/Hypnotics Failed - 08/29/2024  3:46 PM      Failed - This refill cannot be delegated      Failed - Urine Drug Screen completed in last 360 days      Passed - Valid encounter within last 6 months    Recent Outpatient Visits           4 months ago Carpal tunnel syndrome on left   [facility] [place], Marsa PARAS, [place]
